# Patient Record
Sex: Male | Born: 1940 | Race: White | Hispanic: No | Marital: Married | Smoking: Current every day smoker
Health system: Southern US, Community
[De-identification: ages and names within clinical notes are randomized; demographics above are authoritative.]

## PROBLEM LIST (undated history)

## (undated) ENCOUNTER — Emergency Department (HOSPITAL_COMMUNITY): Payer: Medicare Other | Source: Home / Self Care

## (undated) DIAGNOSIS — E7800 Pure hypercholesterolemia, unspecified: Secondary | ICD-10-CM

## (undated) DIAGNOSIS — I10 Essential (primary) hypertension: Secondary | ICD-10-CM

## (undated) DIAGNOSIS — C189 Malignant neoplasm of colon, unspecified: Secondary | ICD-10-CM

## (undated) DIAGNOSIS — R519 Headache, unspecified: Secondary | ICD-10-CM

## (undated) DIAGNOSIS — C61 Malignant neoplasm of prostate: Secondary | ICD-10-CM

## (undated) DIAGNOSIS — Z95828 Presence of other vascular implants and grafts: Secondary | ICD-10-CM

## (undated) DIAGNOSIS — I8390 Asymptomatic varicose veins of unspecified lower extremity: Secondary | ICD-10-CM

## (undated) DIAGNOSIS — R51 Headache: Secondary | ICD-10-CM

## (undated) DIAGNOSIS — M899 Disorder of bone, unspecified: Secondary | ICD-10-CM

## (undated) HISTORY — PX: COLON SURGERY: SHX602

## (undated) HISTORY — DX: Malignant neoplasm of prostate: C61

## (undated) HISTORY — DX: Presence of other vascular implants and grafts: Z95.828

## (undated) HISTORY — DX: Disorder of bone, unspecified: M89.9

## (undated) HISTORY — DX: Asymptomatic varicose veins of unspecified lower extremity: I83.90

## (undated) HISTORY — PX: STYLOID PROCESS EXCISION: SHX5198

## (undated) HISTORY — PX: HAND TENDON SURGERY: SHX663

## (undated) HISTORY — PX: PROSTATE SURGERY: SHX751

---

## 1979-04-08 HISTORY — PX: VARICOSE VEIN SURGERY: SHX832

## 1983-08-08 DIAGNOSIS — I8390 Asymptomatic varicose veins of unspecified lower extremity: Secondary | ICD-10-CM

## 1983-08-08 HISTORY — DX: Asymptomatic varicose veins of unspecified lower extremity: I83.90

## 1993-08-07 DIAGNOSIS — M899 Disorder of bone, unspecified: Secondary | ICD-10-CM

## 1993-08-07 HISTORY — DX: Disorder of bone, unspecified: M89.9

## 2003-09-24 ENCOUNTER — Other Ambulatory Visit: Admission: RE | Admit: 2003-09-24 | Discharge: 2003-09-24 | Payer: Self-pay | Admitting: Dermatology

## 2009-02-22 ENCOUNTER — Encounter (INDEPENDENT_AMBULATORY_CARE_PROVIDER_SITE_OTHER): Payer: Self-pay | Admitting: General Surgery

## 2009-02-22 ENCOUNTER — Inpatient Hospital Stay (HOSPITAL_COMMUNITY): Admission: RE | Admit: 2009-02-22 | Discharge: 2009-02-26 | Payer: Self-pay | Admitting: General Surgery

## 2009-03-22 ENCOUNTER — Ambulatory Visit (HOSPITAL_COMMUNITY): Payer: Self-pay | Admitting: Internal Medicine

## 2009-03-22 ENCOUNTER — Encounter (HOSPITAL_COMMUNITY): Admission: RE | Admit: 2009-03-22 | Discharge: 2009-04-21 | Payer: Self-pay | Admitting: Oncology

## 2009-04-05 ENCOUNTER — Ambulatory Visit (HOSPITAL_COMMUNITY): Admission: RE | Admit: 2009-04-05 | Discharge: 2009-04-05 | Payer: Self-pay | Admitting: General Surgery

## 2009-04-22 ENCOUNTER — Encounter (HOSPITAL_COMMUNITY): Admission: RE | Admit: 2009-04-22 | Discharge: 2009-05-05 | Payer: Self-pay | Admitting: Oncology

## 2009-05-10 ENCOUNTER — Encounter (HOSPITAL_COMMUNITY): Admission: RE | Admit: 2009-05-10 | Discharge: 2009-06-09 | Payer: Self-pay | Admitting: Oncology

## 2009-05-10 ENCOUNTER — Ambulatory Visit (HOSPITAL_COMMUNITY): Payer: Self-pay | Admitting: Oncology

## 2009-06-21 ENCOUNTER — Encounter (HOSPITAL_COMMUNITY): Admission: RE | Admit: 2009-06-21 | Discharge: 2009-07-21 | Payer: Self-pay | Admitting: Oncology

## 2009-06-30 ENCOUNTER — Ambulatory Visit (HOSPITAL_COMMUNITY): Payer: Self-pay | Admitting: Oncology

## 2009-07-21 ENCOUNTER — Encounter (HOSPITAL_COMMUNITY): Admission: RE | Admit: 2009-07-21 | Discharge: 2009-08-04 | Payer: Self-pay | Admitting: Oncology

## 2009-08-31 ENCOUNTER — Encounter (HOSPITAL_COMMUNITY): Admission: RE | Admit: 2009-08-31 | Discharge: 2009-09-30 | Payer: Self-pay | Admitting: Oncology

## 2009-08-31 ENCOUNTER — Ambulatory Visit (HOSPITAL_COMMUNITY): Payer: Self-pay | Admitting: Oncology

## 2009-10-12 ENCOUNTER — Encounter (HOSPITAL_COMMUNITY): Admission: RE | Admit: 2009-10-12 | Discharge: 2009-11-11 | Payer: Self-pay | Admitting: Oncology

## 2009-11-12 ENCOUNTER — Ambulatory Visit (HOSPITAL_COMMUNITY): Payer: Self-pay | Admitting: Oncology

## 2009-11-23 ENCOUNTER — Encounter (HOSPITAL_COMMUNITY): Admission: RE | Admit: 2009-11-23 | Discharge: 2009-12-23 | Payer: Self-pay | Admitting: Oncology

## 2010-01-04 ENCOUNTER — Encounter (HOSPITAL_COMMUNITY): Admission: RE | Admit: 2010-01-04 | Discharge: 2010-02-03 | Payer: Self-pay | Admitting: Oncology

## 2010-01-04 ENCOUNTER — Ambulatory Visit (HOSPITAL_COMMUNITY): Payer: Self-pay | Admitting: Oncology

## 2010-02-11 ENCOUNTER — Encounter (HOSPITAL_COMMUNITY): Admission: RE | Admit: 2010-02-11 | Discharge: 2010-03-13 | Payer: Self-pay | Admitting: Oncology

## 2010-03-25 ENCOUNTER — Ambulatory Visit (HOSPITAL_COMMUNITY): Payer: Self-pay | Admitting: Oncology

## 2010-05-06 ENCOUNTER — Encounter (HOSPITAL_COMMUNITY)
Admission: RE | Admit: 2010-05-06 | Discharge: 2010-06-05 | Payer: Self-pay | Source: Home / Self Care | Admitting: Oncology

## 2010-06-17 ENCOUNTER — Encounter (HOSPITAL_COMMUNITY)
Admission: RE | Admit: 2010-06-17 | Discharge: 2010-07-17 | Payer: Self-pay | Source: Home / Self Care | Attending: Oncology | Admitting: Oncology

## 2010-06-17 ENCOUNTER — Ambulatory Visit (HOSPITAL_COMMUNITY): Payer: Self-pay | Admitting: Oncology

## 2010-07-23 ENCOUNTER — Emergency Department (HOSPITAL_COMMUNITY)
Admission: EM | Admit: 2010-07-23 | Discharge: 2010-07-23 | Payer: Self-pay | Source: Home / Self Care | Admitting: Emergency Medicine

## 2010-08-05 ENCOUNTER — Ambulatory Visit (HOSPITAL_COMMUNITY): Payer: Self-pay | Admitting: Oncology

## 2010-08-05 ENCOUNTER — Encounter (HOSPITAL_COMMUNITY)
Admission: RE | Admit: 2010-08-05 | Discharge: 2010-09-04 | Payer: Self-pay | Source: Home / Self Care | Attending: Oncology | Admitting: Oncology

## 2010-08-26 ENCOUNTER — Other Ambulatory Visit (HOSPITAL_COMMUNITY): Payer: Self-pay | Admitting: Oncology

## 2010-08-26 DIAGNOSIS — C189 Malignant neoplasm of colon, unspecified: Secondary | ICD-10-CM

## 2010-08-27 ENCOUNTER — Other Ambulatory Visit (HOSPITAL_COMMUNITY): Payer: Self-pay | Admitting: Oncology

## 2010-08-27 DIAGNOSIS — C61 Malignant neoplasm of prostate: Secondary | ICD-10-CM

## 2010-08-27 DIAGNOSIS — C189 Malignant neoplasm of colon, unspecified: Secondary | ICD-10-CM

## 2010-09-16 ENCOUNTER — Other Ambulatory Visit (HOSPITAL_COMMUNITY): Payer: Self-pay

## 2010-09-20 ENCOUNTER — Other Ambulatory Visit (HOSPITAL_COMMUNITY): Payer: Medicare Other

## 2010-09-20 DIAGNOSIS — C189 Malignant neoplasm of colon, unspecified: Secondary | ICD-10-CM

## 2010-09-20 DIAGNOSIS — Z452 Encounter for adjustment and management of vascular access device: Secondary | ICD-10-CM

## 2010-10-17 LAB — COMPREHENSIVE METABOLIC PANEL
AST: 20 U/L (ref 0–37)
Albumin: 3.7 g/dL (ref 3.5–5.2)
BUN: 10 mg/dL (ref 6–23)
Calcium: 9.5 mg/dL (ref 8.4–10.5)
Creatinine, Ser: 0.97 mg/dL (ref 0.4–1.5)
GFR calc Af Amer: >60 mL/min (ref >60)
Total Bilirubin: 0.4 mg/dL (ref 0.3–1.2)
Total Protein: 7.4 g/dL (ref 6.0–8.3)

## 2010-10-17 LAB — CBC
Hemoglobin: 15.7 g/dL (ref 13.0–17.0)
MCH: 31 pg (ref 26.0–34.0)
MCHC: 34.9 g/dL (ref 30.0–36.0)
Platelets: 267 10*3/uL (ref 150–400)
RDW: 13.3 % (ref 11.5–15.5)

## 2010-10-17 LAB — DIFFERENTIAL
Basophils Absolute: 0 10*3/uL (ref 0.0–0.1)
Eosinophils Relative: 2 % (ref 0–5)
Lymphocytes Relative: 36 % (ref 12–46)
Lymphs Abs: 4.3 10*3/uL — ABNORMAL HIGH (ref 0.7–4.0)
Monocytes Absolute: 1.1 10*3/uL — ABNORMAL HIGH (ref 0.1–1.0)
Neutro Abs: 6.5 10*3/uL (ref 1.7–7.7)

## 2010-10-17 LAB — CEA: CEA: 4.5 ng/mL (ref 0.0–5.0)

## 2010-10-17 LAB — FREE PSA: PSA, Free: <0.1 ng/mL

## 2010-10-17 LAB — PSA: PSA: 0.45 ng/mL (ref <=4.00)

## 2010-10-20 LAB — COMPREHENSIVE METABOLIC PANEL
ALT: 15 U/L (ref 0–53)
AST: 19 U/L (ref 0–37)
Alkaline Phosphatase: 106 U/L (ref 39–117)
Calcium: 9.5 mg/dL (ref 8.4–10.5)
GFR calc Af Amer: >60 mL/min (ref >60)
Potassium: 4 mEq/L (ref 3.5–5.1)
Sodium: 141 mEq/L (ref 135–145)
Total Protein: 6.8 g/dL (ref 6.0–8.3)

## 2010-10-20 LAB — CBC
HCT: 42.3 % (ref 39.0–52.0)
MCV: 92 fL (ref 78.0–100.0)
Platelets: 275 10*3/uL (ref 150–400)
RBC: 4.6 MIL/uL (ref 4.22–5.81)
RDW: 13.4 % (ref 11.5–15.5)
WBC: 11.2 10*3/uL — ABNORMAL HIGH (ref 4.0–10.5)

## 2010-10-23 LAB — COMPREHENSIVE METABOLIC PANEL
ALT: 20 U/L (ref 0–53)
Alkaline Phosphatase: 110 U/L (ref 39–117)
CO2: 25 mEq/L (ref 19–32)
Chloride: 105 mEq/L (ref 96–112)
GFR calc non Af Amer: >60 mL/min (ref >60)
Glucose, Bld: 112 mg/dL — ABNORMAL HIGH (ref 70–99)
Potassium: 4.2 mEq/L (ref 3.5–5.1)
Sodium: 136 mEq/L (ref 135–145)
Total Bilirubin: 0.5 mg/dL (ref 0.3–1.2)
Total Protein: 7.3 g/dL (ref 6.0–8.3)

## 2010-10-23 LAB — CBC
HCT: 49 % (ref 39.0–52.0)
Hemoglobin: 16.4 g/dL (ref 13.0–17.0)
MCH: 30.8 pg (ref 26.0–34.0)
MCV: 92.3 fL (ref 78.0–100.0)
RBC: 5.31 MIL/uL (ref 4.22–5.81)
WBC: 10.4 10*3/uL (ref 4.0–10.5)

## 2010-10-23 LAB — DIFFERENTIAL
Basophils Absolute: 0.1 10*3/uL (ref 0.0–0.1)
Basophils Relative: 1 % (ref 0–1)
Eosinophils Absolute: 0.1 10*3/uL (ref 0.0–0.7)
Monocytes Relative: 8 % (ref 3–12)
Neutrophils Relative %: 62 % (ref 43–77)

## 2010-10-24 LAB — COMPREHENSIVE METABOLIC PANEL
Albumin: 3.7 g/dL (ref 3.5–5.2)
BUN: 13 mg/dL (ref 6–23)
Creatinine, Ser: 0.89 mg/dL (ref 0.4–1.5)
Potassium: 4.4 mEq/L (ref 3.5–5.1)
Total Protein: 6.8 g/dL (ref 6.0–8.3)

## 2010-10-24 LAB — CEA: CEA: 4.6 ng/mL (ref 0.0–5.0)

## 2010-10-24 LAB — DIFFERENTIAL
Lymphocytes Relative: 41 % (ref 12–46)
Monocytes Absolute: 0.9 10*3/uL (ref 0.1–1.0)
Monocytes Relative: 9 % (ref 3–12)
Neutro Abs: 5 10*3/uL (ref 1.7–7.7)

## 2010-10-24 LAB — CBC
MCV: 92.9 fL (ref 78.0–100.0)
Platelets: 256 10*3/uL (ref 150–400)
WBC: 10.2 10*3/uL (ref 4.0–10.5)

## 2010-10-25 LAB — PSA: PSA: 0.27 ng/mL (ref 0.10–4.00)

## 2010-10-26 LAB — DIFFERENTIAL
Basophils Absolute: 0.1 10*3/uL (ref 0.0–0.1)
Basophils Relative: 0 % (ref 0–1)
Eosinophils Absolute: 0.2 10*3/uL (ref 0.0–0.7)
Eosinophils Relative: 2 % (ref 0–5)
Monocytes Absolute: 1.6 10*3/uL — ABNORMAL HIGH (ref 0.1–1.0)

## 2010-10-26 LAB — COMPREHENSIVE METABOLIC PANEL
ALT: 28 U/L (ref 0–53)
AST: 30 U/L (ref 0–37)
Albumin: 3.4 g/dL — ABNORMAL LOW (ref 3.5–5.2)
Alkaline Phosphatase: 126 U/L — ABNORMAL HIGH (ref 39–117)
CO2: 30 mEq/L (ref 19–32)
Chloride: 106 mEq/L (ref 96–112)
GFR calc Af Amer: >60 mL/min (ref >60)
GFR calc non Af Amer: >60 mL/min (ref >60)
Potassium: 4.3 mEq/L (ref 3.5–5.1)
Total Bilirubin: 0.4 mg/dL (ref 0.3–1.2)

## 2010-10-26 LAB — CBC
Hemoglobin: 15.9 g/dL (ref 13.0–17.0)
MCHC: 33.8 g/dL (ref 30.0–36.0)
RBC: 4.61 MIL/uL (ref 4.22–5.81)
WBC: 12.9 10*3/uL — ABNORMAL HIGH (ref 4.0–10.5)

## 2010-10-26 LAB — PSA: PSA: 0.19 ng/mL (ref 0.10–4.00)

## 2010-10-26 LAB — CEA: CEA: 6.6 ng/mL — ABNORMAL HIGH (ref 0.0–5.0)

## 2010-11-01 ENCOUNTER — Encounter (HOSPITAL_COMMUNITY): Payer: Medicare Other | Attending: Oncology

## 2010-11-01 ENCOUNTER — Other Ambulatory Visit (HOSPITAL_COMMUNITY): Payer: Medicare Other

## 2010-11-01 DIAGNOSIS — J4489 Other specified chronic obstructive pulmonary disease: Secondary | ICD-10-CM | POA: Insufficient documentation

## 2010-11-01 DIAGNOSIS — R972 Elevated prostate specific antigen [PSA]: Secondary | ICD-10-CM | POA: Insufficient documentation

## 2010-11-01 DIAGNOSIS — C189 Malignant neoplasm of colon, unspecified: Secondary | ICD-10-CM

## 2010-11-01 DIAGNOSIS — F17200 Nicotine dependence, unspecified, uncomplicated: Secondary | ICD-10-CM | POA: Insufficient documentation

## 2010-11-01 DIAGNOSIS — C61 Malignant neoplasm of prostate: Secondary | ICD-10-CM

## 2010-11-01 DIAGNOSIS — J449 Chronic obstructive pulmonary disease, unspecified: Secondary | ICD-10-CM | POA: Insufficient documentation

## 2010-11-01 DIAGNOSIS — Z09 Encounter for follow-up examination after completed treatment for conditions other than malignant neoplasm: Secondary | ICD-10-CM | POA: Insufficient documentation

## 2010-11-01 DIAGNOSIS — Z85038 Personal history of other malignant neoplasm of large intestine: Secondary | ICD-10-CM | POA: Insufficient documentation

## 2010-11-01 DIAGNOSIS — Z8546 Personal history of malignant neoplasm of prostate: Secondary | ICD-10-CM | POA: Insufficient documentation

## 2010-11-08 LAB — DIFFERENTIAL
Basophils Absolute: 0 10*3/uL (ref 0.0–0.1)
Basophils Relative: 1 % (ref 0–1)
Eosinophils Absolute: 0.1 10*3/uL (ref 0.0–0.7)
Monocytes Relative: 16 % — ABNORMAL HIGH (ref 3–12)
Neutrophils Relative %: 41 % — ABNORMAL LOW (ref 43–77)

## 2010-11-08 LAB — CBC
MCHC: 34.1 g/dL (ref 30.0–36.0)
MCV: 98.9 fL (ref 78.0–100.0)
Platelets: 130 10*3/uL — ABNORMAL LOW (ref 150–400)
RBC: 4.39 MIL/uL (ref 4.22–5.81)
RDW: 27.5 % — ABNORMAL HIGH (ref 11.5–15.5)

## 2010-11-09 LAB — COMPREHENSIVE METABOLIC PANEL
ALT: 32 U/L (ref 0–53)
AST: 32 U/L (ref 0–37)
CO2: 27 mEq/L (ref 19–32)
Calcium: 9.1 mg/dL (ref 8.4–10.5)
GFR calc Af Amer: >60 mL/min (ref >60)
Potassium: 3.9 mEq/L (ref 3.5–5.1)
Sodium: 138 mEq/L (ref 135–145)
Total Protein: 6.4 g/dL (ref 6.0–8.3)

## 2010-11-09 LAB — CBC
HCT: 42 % (ref 39.0–52.0)
MCHC: 33.6 g/dL (ref 30.0–36.0)
MCHC: 34.5 g/dL (ref 30.0–36.0)
MCV: 89.9 fL (ref 78.0–100.0)
Platelets: 162 10*3/uL (ref 150–400)
RBC: 4.56 MIL/uL (ref 4.22–5.81)
RDW: 29.6 % — ABNORMAL HIGH (ref 11.5–15.5)

## 2010-11-09 LAB — DIFFERENTIAL
Basophils Absolute: 0.1 10*3/uL (ref 0.0–0.1)
Basophils Relative: 1 % (ref 0–1)
Eosinophils Absolute: 0.1 10*3/uL (ref 0.0–0.7)
Eosinophils Absolute: 0.1 10*3/uL (ref 0.0–0.7)
Eosinophils Relative: 1 % (ref 0–5)
Eosinophils Relative: 1 % (ref 0–5)
Lymphs Abs: 2.9 10*3/uL (ref 0.7–4.0)
Monocytes Relative: 17 % — ABNORMAL HIGH (ref 3–12)

## 2010-11-10 LAB — DIFFERENTIAL
Basophils Absolute: 0 10*3/uL (ref 0.0–0.1)
Eosinophils Relative: 0 % (ref 0–5)
Lymphocytes Relative: 31 % (ref 12–46)
Monocytes Absolute: 1.4 10*3/uL — ABNORMAL HIGH (ref 0.1–1.0)

## 2010-11-10 LAB — COMPREHENSIVE METABOLIC PANEL
AST: 24 U/L (ref 0–37)
Albumin: 3.7 g/dL (ref 3.5–5.2)
Chloride: 106 mEq/L (ref 96–112)
Creatinine, Ser: 0.79 mg/dL (ref 0.4–1.5)
GFR calc Af Amer: >60 mL/min (ref >60)
Potassium: 2.9 mEq/L — ABNORMAL LOW (ref 3.5–5.1)
Total Bilirubin: 0.5 mg/dL (ref 0.3–1.2)

## 2010-11-10 LAB — CBC
MCV: 83.5 fL (ref 78.0–100.0)
Platelets: 219 10*3/uL (ref 150–400)
WBC: 10.5 10*3/uL (ref 4.0–10.5)

## 2010-11-11 LAB — CBC
HCT: 36.8 % — ABNORMAL LOW (ref 39.0–52.0)
MCHC: 34.1 g/dL (ref 30.0–36.0)
MCV: 81.3 fL (ref 78.0–100.0)
Platelets: 336 10*3/uL (ref 150–400)
Platelets: 371 10*3/uL (ref 150–400)
RDW: 15.9 % — ABNORMAL HIGH (ref 11.5–15.5)
RDW: 15.4 % (ref 11.5–15.5)

## 2010-11-11 LAB — COMPREHENSIVE METABOLIC PANEL
ALT: 12 U/L (ref 0–53)
Albumin: 3.5 g/dL (ref 3.5–5.2)
Alkaline Phosphatase: 92 U/L (ref 39–117)
Calcium: 9.2 mg/dL (ref 8.4–10.5)
Potassium: 3.7 mEq/L (ref 3.5–5.1)
Sodium: 138 mEq/L (ref 135–145)
Total Protein: 6.9 g/dL (ref 6.0–8.3)

## 2010-11-11 LAB — DIFFERENTIAL
Basophils Absolute: 0 10*3/uL (ref 0.0–0.1)
Basophils Relative: 0 % (ref 0–1)
Basophils Relative: 1 % (ref 0–1)
Eosinophils Absolute: 0 10*3/uL (ref 0.0–0.7)
Eosinophils Absolute: 0.1 10*3/uL (ref 0.0–0.7)
Eosinophils Relative: 0 % (ref 0–5)
Lymphocytes Relative: 19 % (ref 12–46)
Lymphs Abs: 3.1 10*3/uL (ref 0.7–4.0)
Monocytes Absolute: 1.2 10*3/uL — ABNORMAL HIGH (ref 0.1–1.0)
Monocytes Relative: 11 % (ref 3–12)
Neutro Abs: 6.5 10*3/uL (ref 1.7–7.7)

## 2010-11-11 LAB — FERRITIN: Ferritin: 20 ng/mL — ABNORMAL LOW (ref 22–322)

## 2010-11-12 LAB — COMPREHENSIVE METABOLIC PANEL
AST: 16 U/L (ref 0–37)
BUN: 13 mg/dL (ref 6–23)
CO2: 30 mEq/L (ref 19–32)
Calcium: 9.5 mg/dL (ref 8.4–10.5)
Chloride: 106 mEq/L (ref 96–112)
Creatinine, Ser: 0.86 mg/dL (ref 0.4–1.5)
GFR calc Af Amer: >60 mL/min (ref >60)
GFR calc non Af Amer: >60 mL/min (ref >60)
Total Bilirubin: 0.1 mg/dL — ABNORMAL LOW (ref 0.3–1.2)

## 2010-11-12 LAB — CBC
Hemoglobin: 12.8 g/dL — ABNORMAL LOW (ref 13.0–17.0)
MCHC: 33.5 g/dL (ref 30.0–36.0)
MCHC: 33.8 g/dL (ref 30.0–36.0)
MCV: 80.8 fL (ref 78.0–100.0)
MCV: 81.9 fL (ref 78.0–100.0)
Platelets: 340 10*3/uL (ref 150–400)
RBC: 4.85 MIL/uL (ref 4.22–5.81)
RDW: 15.4 % (ref 11.5–15.5)

## 2010-11-12 LAB — BASIC METABOLIC PANEL
BUN: 9 mg/dL (ref 6–23)
CO2: 28 mEq/L (ref 19–32)
Chloride: 104 mEq/L (ref 96–112)
Creatinine, Ser: 0.83 mg/dL (ref 0.4–1.5)

## 2010-11-12 LAB — IRON AND TIBC
Iron: 24 ug/dL — ABNORMAL LOW (ref 42–135)
Saturation Ratios: 6 % — ABNORMAL LOW (ref 20–55)
TIBC: 376 ug/dL (ref 215–435)

## 2010-11-12 LAB — DIFFERENTIAL
Basophils Absolute: 0.1 10*3/uL (ref 0.0–0.1)
Basophils Relative: 1 % (ref 0–1)
Neutro Abs: 6.2 10*3/uL (ref 1.7–7.7)
Neutrophils Relative %: 50 % (ref 43–77)

## 2010-11-13 LAB — CBC
HCT: 33.4 % — ABNORMAL LOW (ref 39.0–52.0)
HCT: 36.1 % — ABNORMAL LOW (ref 39.0–52.0)
MCV: 82.1 fL (ref 78.0–100.0)
MCV: 82.5 fL (ref 78.0–100.0)
MCV: 83.9 fL (ref 78.0–100.0)
Platelets: 254 10*3/uL (ref 150–400)
Platelets: 307 10*3/uL (ref 150–400)
RBC: 4.38 MIL/uL (ref 4.22–5.81)
RDW: 15.2 % (ref 11.5–15.5)
WBC: 13 10*3/uL — ABNORMAL HIGH (ref 4.0–10.5)
WBC: 18 10*3/uL — ABNORMAL HIGH (ref 4.0–10.5)

## 2010-11-13 LAB — BASIC METABOLIC PANEL
BUN: 8 mg/dL (ref 6–23)
Chloride: 106 mEq/L (ref 96–112)
Chloride: 107 mEq/L (ref 96–112)
GFR calc Af Amer: >60 mL/min (ref >60)
GFR calc non Af Amer: >60 mL/min (ref >60)
GFR calc non Af Amer: >60 mL/min (ref >60)
Glucose, Bld: 120 mg/dL — ABNORMAL HIGH (ref 70–99)
Potassium: 3.9 mEq/L (ref 3.5–5.1)
Potassium: 4.1 mEq/L (ref 3.5–5.1)

## 2010-11-13 LAB — DIFFERENTIAL
Basophils Absolute: 0 10*3/uL (ref 0.0–0.1)
Eosinophils Absolute: 0.1 10*3/uL (ref 0.0–0.7)
Eosinophils Relative: 0 % (ref 0–5)
Eosinophils Relative: 1 % (ref 0–5)
Lymphocytes Relative: 12 % (ref 12–46)
Lymphs Abs: 2.1 10*3/uL (ref 0.7–4.0)
Monocytes Absolute: 1.9 10*3/uL — ABNORMAL HIGH (ref 0.1–1.0)
Neutro Abs: 14 10*3/uL — ABNORMAL HIGH (ref 1.7–7.7)

## 2010-11-13 LAB — COMPREHENSIVE METABOLIC PANEL
AST: 16 U/L (ref 0–37)
Albumin: 3.8 g/dL (ref 3.5–5.2)
Chloride: 103 mEq/L (ref 96–112)
Creatinine, Ser: 0.82 mg/dL (ref 0.4–1.5)
GFR calc Af Amer: >60 mL/min (ref >60)
Total Bilirubin: 0.6 mg/dL (ref 0.3–1.2)

## 2010-11-13 LAB — CROSSMATCH

## 2010-11-13 LAB — MAGNESIUM
Magnesium: 1.8 mg/dL (ref 1.5–2.5)
Magnesium: 2.1 mg/dL (ref 1.5–2.5)

## 2010-11-13 LAB — CEA: CEA: 3.3 ng/mL (ref 0.0–5.0)

## 2010-11-13 LAB — ALBUMIN: Albumin: 2.8 g/dL — ABNORMAL LOW (ref 3.5–5.2)

## 2010-12-13 ENCOUNTER — Encounter (HOSPITAL_COMMUNITY): Payer: Medicare Other | Attending: Oncology

## 2010-12-13 DIAGNOSIS — R972 Elevated prostate specific antigen [PSA]: Secondary | ICD-10-CM | POA: Insufficient documentation

## 2010-12-13 DIAGNOSIS — J4489 Other specified chronic obstructive pulmonary disease: Secondary | ICD-10-CM | POA: Insufficient documentation

## 2010-12-13 DIAGNOSIS — Z09 Encounter for follow-up examination after completed treatment for conditions other than malignant neoplasm: Secondary | ICD-10-CM | POA: Insufficient documentation

## 2010-12-13 DIAGNOSIS — Z8546 Personal history of malignant neoplasm of prostate: Secondary | ICD-10-CM | POA: Insufficient documentation

## 2010-12-13 DIAGNOSIS — Z85038 Personal history of other malignant neoplasm of large intestine: Secondary | ICD-10-CM | POA: Insufficient documentation

## 2010-12-13 DIAGNOSIS — J449 Chronic obstructive pulmonary disease, unspecified: Secondary | ICD-10-CM | POA: Insufficient documentation

## 2010-12-13 DIAGNOSIS — C189 Malignant neoplasm of colon, unspecified: Secondary | ICD-10-CM

## 2010-12-13 DIAGNOSIS — F17200 Nicotine dependence, unspecified, uncomplicated: Secondary | ICD-10-CM | POA: Insufficient documentation

## 2010-12-13 DIAGNOSIS — Z452 Encounter for adjustment and management of vascular access device: Secondary | ICD-10-CM

## 2010-12-20 NOTE — H&P (Signed)
Roberto [name], Roberto [name]               ACCOUNT NO.:  [id number]   MEDICAL RECORD NO.:  [id number]          PATIENT TYPE:  AMB   LOCATION:  DAY                           FACILITY:  [facility]   PHYSICIAN:  Dalia Heading, M.D.  DATE OF BIRTH:  [birth year]   DATE OF ADMISSION:  DATE OF DISCHARGE:  LH                              HISTORY & PHYSICAL   CHIEF COMPLAINT:  Neoplasm, unspecified.   HISTORY OF PRESENT ILLNESS:  The patient is a 70 year old white male who  is referred for evaluation and treatment of the colon neoplasm.  This  was found on CT scan for workup of a rising PSA level.  He has a history  of prostate cancer.  Colonoscopy found a mass that was circumferential  in nature in the ascending colon.  The patient has been otherwise  asymptomatic.  No family history of colon carcinoma is noted.   PAST MEDICAL HISTORY:  Prostate cancer.   PAST SURGICAL HISTORY:  Prostatectomy in 2004, varicose vein excision,  styloid bone removal in 1992.   CURRENT MEDICATIONS:  Lovastatin 40 mg p.o. daily   ALLERGIES:  No known drug allergies.   REVIEW OF SYSTEMS:  The patient smokes over a pack of cigarettes a day.  He denies any alcohol use.  He denies any other cardiopulmonary  difficulties or bleeding disorders.  He states that did have somewhat of  a difficult time intubating him in 2004, but ultimately he was able to  be intubated for his prostate surgery once [facility] had the  appropriate equipment present.   PHYSICAL EXAMINATION:  GENERAL:  The patient is a well-developed, well-  nourished white male in no acute distress.  HEENT:  No scleral icterus.  LUNGS:  Clear to auscultation with equal breath sounds bilaterally.  HEART:  Regular rate and rhythm without S3, S4, or murmurs.  ABDOMEN:  Soft, nontender, nondistended.  No hepatosplenomegaly, masses,  or hernias identified.  Reports from Dr. Darryll Capers office in [place]  were noted.   IMPRESSION:  Neoplasm, unspecified.   PLAN:  The patient is scheduled for a right hemicolectomy on February 22, 2009.  The risks and benefits of the procedure including bleeding,  infection, cardiopulmonary difficulties, the possibly of a blood  transfusion were fully explained to the patient, gave informed consent.      Dalia Heading, M.D.  Electronically Signed     MAJ/MEDQ  D:  02/11/2009  T:  02/12/2009  Job:  [id number]   cc:   Short-Stay at [facility]   Dr. Christeen Douglas, [place]

## 2010-12-20 NOTE — H&P (Signed)
[name], [name]               ACCOUNT NO.:  [id number]   MEDICAL RECORD NO.:  [id number]         PATIENT TYPE:  PAMB   LOCATION:  DAY                           FACILITY:  [facility]   PHYSICIAN:  Dalia Heading, M.D.  DATE OF BIRTH:  1941-07-15   DATE OF ADMISSION:  DATE OF DISCHARGE:  LH                              HISTORY & PHYSICAL   CHIEF COMPLAINT:  Colon carcinoma, T4, N1, M0, need for central venous  access.   HISTORY OF PRESENT ILLNESS:  The patient is a 70 year old, white male,  who presents for Port-A-Cath insertion.  He recently underwent a partial  colectomy for colon carcinoma and is in need for central venous access  for chemotherapy.   PAST MEDICAL HISTORY:  Prostate cancer.   PAST SURGICAL HISTORY:  As noted above, prostatectomy in 2004, styloid  bone removal in 1992.   CURRENT MEDICATIONS:  1. Lovastatin 40 mg p.o. daily.  2. Vicodin p.r.n. pain.   ALLERGIES:  No known drug allergies.   REVIEW OF SYSTEMS:  The patient smokes a pack of cigarettes a day.  He  denies any alcohol use.  He denies any other cardiopulmonary  difficulties or bleeding disorders.   PHYSICAL EXAMINATION:  GENERAL:  The patient is a well-developed, well-  nourished, white male, in no acute distress.  LUNGS:  Clear to auscultation with equal breath sounds bilaterally.  HEART:  Regular rate and rhythm without S3, S4, murmurs.  ABDOMEN:  Soft, nontender, nondistended.  A well-healed surgical scars  are noted.   IMPRESSION:  Colon carcinoma, need for central venous access.   PLAN:  The patient is scheduled for Port-A-Cath insertion on April 05, 2009.  The risks and benefits of the procedure including bleeding,  infection, pneumothorax were fully explained to the patient, gave  informed consent.      Dalia Heading, M.D.  Electronically Signed     MAJ/MEDQ  D:  03/31/2009  T:  04/01/2009  Job:  [id number]   cc:   Ladona Horns. Mariel Sleet, MD  Fax: [phone]   [place]

## 2010-12-20 NOTE — Op Note (Signed)
NAMEGREYSON, Roberto [name]               ACCOUNT NO.:  [id number]   MEDICAL RECORD NO.:  [id number]          PATIENT TYPE:  INP   LOCATION:  A307                          FACILITY:  [facility]   PHYSICIAN:  Dalia Heading, M.D.  DATE OF BIRTH:  Aug 09, 1940   DATE OF PROCEDURE:  02/22/2009  DATE OF DISCHARGE:                               OPERATIVE REPORT   PREOPERATIVE DIAGNOSIS:  Right colon neoplasm.   POSTOPERATIVE DIAGNOSES:  Right colon neoplasm, liver lesion.   PROCEDURE:  Right hemicolectomy, wedge resection of liver lesion.   SURGEON:  Dalia Heading, MD   ANESTHESIA:  General endotracheal.   INDICATIONS:  The patient is a 69 year old, white male, who was found on  CT scan of the abdomen and colonoscopy to have an ascending colon  neoplasm which was biopsy proven to be adenocarcinoma.  The patient now  comes to the operating room for right hemicolectomy.  The risks and  benefits of the procedure including bleeding, infection, cardiopulmonary  difficulties, and the possibility of needing a blood transfusion were  fully explained to the patient, gave informed consent.   PROCEDURE NOTE:  The patient was placed in supine position.  After  induction of general endotracheal anesthesia, the abdomen was prepped  and draped using the usual sterile technique with Betadine.  Surgical  site confirmation was performed.   A right paramedian incision was made down to the perineum.  The  peritoneal cavity was entered into without difficulty.  The oral gastric  tube was noted to be in appropriate position of the stomach.  The small  bowel was inspected and noted to be within normal limits.  In the  ascending colon, an obvious neoplasm was present just above the cecum.  There was another tattoo mark in the proximal transverse colon.  This  was assumed to be a polypectomy site which by pathology was negative for  malignancy.  On inspection of the liver, there were multiple  granulomatous hard  lesions on the surface of the liver, in close  proximity to the gallbladder as well as the high on the dome of the  liver.  A wedge resection of the largest lesion just in close proximity  to the gallbladder fossa was excised without difficulty.  FloSeal and  Surgicel were placed over the wedge resection site.  This was inspected  later in the procedure and no abnormal bleeding was noted.  The specimen  was sent to pathology for further examination.  The right colon was  mobilized along its peritoneal reflection.  A GIA stapler was placed  across the terminal ileum as well as the mid transverse colon and fired.  The mesentery was divided using the LigaSure.  The specimen was then  sent to pathology for further examination.  The specimen contained the  obvious masses as well as the transverse colon tattooed lesion.  A side-  to-side ileocolic anastomosis was then performed using a GIA 70 stapler.  The enterotomy was closed using a TA stapler.  The staple line was  bolstered using 3-0 silk sutures.  Omentum was then used  to cover the  anastomosis and fixated using 3-0 silk sutures.  The mesenteric defect  was closed using a 2-0 chromic gut running suture.  The bowel was then  returned to the abdominal cavity in orderly fashion.  All operating  personnel then changed their gloves.   The abdomen was copiously irrigated with gentamicin normal saline.  All  fluid was then evacuated from the abdominal cavity.  The fascia was  reapproximated using a looped O Novofil running suture.  An on cue pain  pump was then placed with the catheter underneath the skin.  The skin  was then reapproximated using staples.  Betadine ointment and dry  sterile dressings were applied.   All tape and needle counts correct at the end of the procedure.  The  patient was extubated in the operating room and went back to recovery  room awake in stable condition.  Complications none.   SPECIMEN:  1. Right colon.  2.  Wedge resection of liver.   BLOOD LOSS:  50 mL.      Dalia Heading, M.D.  Electronically Signed     MAJ/MEDQ  D:  02/22/2009  T:  02/23/2009  Job:  [id number]

## 2010-12-20 NOTE — Op Note (Signed)
NAMEDEVONN, [name]               ACCOUNT NO.:  [id number]   MEDICAL RECORD NO.:  [id number]          PATIENT TYPE:  AMB   LOCATION:  DAY                           FACILITY:  [facility]   PHYSICIAN:  Dalia Heading, M.D.  DATE OF BIRTH:  1941/06/12   DATE OF PROCEDURE:  DATE OF DISCHARGE:                               OPERATIVE REPORT   PREOPERATIVE DIAGNOSIS:  Colon carcinoma, T4, N0, M0, need for central  venous access.   POSTOPERATIVE DIAGNOSIS:  Colon carcinoma, T4, N0, M0, need for central  venous access.   PROCEDURE:  Port-A-Cath insertion.   SURGEON:  Dalia Heading, MD   ANESTHESIA:  MAC.   INDICATIONS:  The patient is a 70 year old white male who recently  underwent partial colectomy for colon carcinoma and is about to undergo  chemotherapy.  The risks and benefits of the procedure including  bleeding, infection, pneumothorax were fully explained to the patient,  gave informed consent.   PROCEDURE NOTE:  The patient was placed in the Trendelenburg position  after the left upper chest was prepped and draped using the usual  sterile technique with DuraPrep.  Surgical site confirmation was  performed.  Xylocaine 1% was used for local anesthesia.   An incision was made below the left clavicle.  A subcutaneous pocket was  then formed.  A needle was advanced into left subclavian vein using the  Seldinger technique without difficulty.  Guidewire was then advanced  into the right atrium under fluoroscopic guidance.  An introducer and  peel-away sheath were placed over the guidewire.  The catheter was then  inserted through the peel-away sheath, and the peel-away sheath was  removed.  The catheter was then attached to the port.  The port placed  in the subcutaneous pocket.  Adequate positioning was confirmed by  fluoroscopy.  The subcutaneous layer was reapproximated using a 3-0  Vicryl interrupted suture.  The skin was closed using a 4-0 Vicryl  subcuticular suture.   Dermabond was then applied to the incision.  The  port was then accessed and flushed with 3000 units of heparin.  Good  back bleeding was noted from the port.  The access was left in place as  the patient is to undergo chemotherapy later this week.  A Tegaderm  dressing was then applied.   All tape and needle counts were correct at the end of the procedure.  The patient was transferred to PACU in stable condition.  A chest x-ray  will be performed at that time.   COMPLICATIONS:  None.   SPECIMEN:  None.   BLOOD LOSS:  Minimal.      Dalia Heading, M.D.  Electronically Signed     MAJ/MEDQ  D:  04/05/2009  T:  04/05/2009  Job:  [id number]   cc:   Ladona Horns. Mariel Sleet, MD  Fax: [phone]   Adrienne Mocha, MD

## 2010-12-20 NOTE — Discharge Summary (Signed)
Roberto [name], Roberto [name]               ACCOUNT NO.:  [id number]   MEDICAL RECORD NO.:  [id number]          PATIENT TYPE:  INP   LOCATION:  A307                          FACILITY:  [facility]   PHYSICIAN:  Dalia Heading, M.D.  DATE OF BIRTH:  Feb 13, 1941   DATE OF ADMISSION:  02/22/2009  DATE OF DISCHARGE:  07/23/2010LH                               DISCHARGE SUMMARY   HOSPITAL COURSE SUMMARY:  The patient is a 70 year old white male who  was found on colonoscopy by Dr. Lenore Manner  in [place], [place] to have  an adenocarcinoma of the ascending colon.  He presented to the [facility] on February 22, 2009, and underwent a right hemicolectomy, as  well as wedge resection of a liver lesion.  He tolerated the surgery  well.  His postoperative course was for the most part unremarkable.  His  diet was advanced without difficulty once his bowel function returned.  His CEA level was 3.3.  Final pathology did reveal a T4 N1 M0  adenocarcinoma of the ascending colon.  The 14 lymph nodes were resected  and were negative for malignancy.  He did have some satellite lesions  outside the main lesion of the colon.   The patient is being discharged home on postoperative day #4 in good and  improving condition.   DISCHARGE INSTRUCTIONS:  The patient is to follow up with Dalia Heading, M.D. on March 04, 2009.   DISCHARGE MEDICATIONS:  Include:  1. Vicodin one to two tablets p.o. q.4h. p.r.n. pain.  2. Lovastatin 40 mg p.o. daily.   PRINCIPAL DIAGNOSES:  1. Adenocarcinoma of the ascending colon.  2. High cholesterol levels.   PRINCIPAL PROCEDURE:  Right hemicolectomy with wedge resection of the  liver on February 22, 2009.  Of note was the liver biopsy was negative for  malignancy.      Dalia Heading, M.D.  Electronically Signed     MAJ/MEDQ  D:  02/26/2009  T:  02/26/2009  Job:  [id number]   cc:   Schiflett, Dr.  Octavio Manns, [place]

## 2011-01-24 ENCOUNTER — Other Ambulatory Visit (HOSPITAL_COMMUNITY): Payer: Self-pay | Admitting: Oncology

## 2011-01-24 ENCOUNTER — Encounter (HOSPITAL_COMMUNITY): Payer: Medicare Other | Attending: Oncology

## 2011-01-24 DIAGNOSIS — Z8546 Personal history of malignant neoplasm of prostate: Secondary | ICD-10-CM | POA: Insufficient documentation

## 2011-01-24 DIAGNOSIS — R972 Elevated prostate specific antigen [PSA]: Secondary | ICD-10-CM | POA: Insufficient documentation

## 2011-01-24 DIAGNOSIS — J449 Chronic obstructive pulmonary disease, unspecified: Secondary | ICD-10-CM | POA: Insufficient documentation

## 2011-01-24 DIAGNOSIS — Z09 Encounter for follow-up examination after completed treatment for conditions other than malignant neoplasm: Secondary | ICD-10-CM | POA: Insufficient documentation

## 2011-01-24 DIAGNOSIS — Z85038 Personal history of other malignant neoplasm of large intestine: Secondary | ICD-10-CM | POA: Insufficient documentation

## 2011-01-24 DIAGNOSIS — C189 Malignant neoplasm of colon, unspecified: Secondary | ICD-10-CM

## 2011-01-24 DIAGNOSIS — F17200 Nicotine dependence, unspecified, uncomplicated: Secondary | ICD-10-CM | POA: Insufficient documentation

## 2011-01-24 DIAGNOSIS — J4489 Other specified chronic obstructive pulmonary disease: Secondary | ICD-10-CM | POA: Insufficient documentation

## 2011-01-24 LAB — COMPREHENSIVE METABOLIC PANEL
ALT: 12 U/L (ref 0–53)
Albumin: 3.7 g/dL (ref 3.5–5.2)
Alkaline Phosphatase: 110 U/L (ref 39–117)
BUN: 18 mg/dL (ref 6–23)
Chloride: 101 mEq/L (ref 96–112)
Potassium: 3.6 mEq/L (ref 3.5–5.1)
Sodium: 139 mEq/L (ref 135–145)
Total Bilirubin: 0.2 mg/dL — ABNORMAL LOW (ref 0.3–1.2)

## 2011-01-24 LAB — DIFFERENTIAL
Eosinophils Relative: 2 % (ref 0–5)
Lymphocytes Relative: 43 % (ref 12–46)
Lymphs Abs: 5.1 10*3/uL — ABNORMAL HIGH (ref 0.7–4.0)
Monocytes Absolute: 1.2 10*3/uL — ABNORMAL HIGH (ref 0.1–1.0)
Neutro Abs: 5.3 10*3/uL (ref 1.7–7.7)

## 2011-01-24 LAB — CBC
MCH: 30.3 pg (ref 26.0–34.0)
MCHC: 33.4 g/dL (ref 30.0–36.0)
Platelets: 315 10*3/uL (ref 150–400)

## 2011-01-25 LAB — CEA: CEA: 3.8 ng/mL (ref 0.0–5.0)

## 2011-01-26 ENCOUNTER — Encounter (HOSPITAL_COMMUNITY): Payer: Self-pay | Admitting: *Deleted

## 2011-02-13 ENCOUNTER — Encounter (HOSPITAL_COMMUNITY): Payer: Self-pay

## 2011-02-13 ENCOUNTER — Ambulatory Visit (HOSPITAL_COMMUNITY): Payer: Self-pay

## 2011-02-13 ENCOUNTER — Encounter (HOSPITAL_COMMUNITY)
Admission: RE | Admit: 2011-02-13 | Discharge: 2011-02-13 | Payer: Medicare Other | Source: Ambulatory Visit | Attending: Oncology | Admitting: Oncology

## 2011-02-13 ENCOUNTER — Encounter (HOSPITAL_COMMUNITY)
Admission: RE | Admit: 2011-02-13 | Discharge: 2011-02-13 | Disposition: A | Discharge status: Home / Self Care | Payer: Medicare Other | Source: Ambulatory Visit | Attending: Oncology | Admitting: Oncology

## 2011-02-13 DIAGNOSIS — C61 Malignant neoplasm of prostate: Secondary | ICD-10-CM | POA: Insufficient documentation

## 2011-02-13 DIAGNOSIS — C189 Malignant neoplasm of colon, unspecified: Secondary | ICD-10-CM | POA: Insufficient documentation

## 2011-02-13 MED ORDER — TECHNETIUM TC 99M MEDRONATE IV KIT
25.0000 | PACK | Freq: Once | INTRAVENOUS | Status: AC | PRN
  Administered 2011-02-13: 24 via INTRAVENOUS

## 2011-02-14 ENCOUNTER — Encounter (HOSPITAL_COMMUNITY): Payer: Self-pay

## 2011-02-14 ENCOUNTER — Other Ambulatory Visit (HOSPITAL_COMMUNITY): Payer: Self-pay

## 2011-02-14 ENCOUNTER — Ambulatory Visit (HOSPITAL_COMMUNITY)
Admission: RE | Admit: 2011-02-14 | Discharge: 2011-02-14 | Disposition: A | Discharge status: Home / Self Care | Payer: Medicare Other | Source: Ambulatory Visit | Attending: Oncology | Admitting: Oncology

## 2011-02-14 DIAGNOSIS — Z8546 Personal history of malignant neoplasm of prostate: Secondary | ICD-10-CM | POA: Insufficient documentation

## 2011-02-14 DIAGNOSIS — C189 Malignant neoplasm of colon, unspecified: Secondary | ICD-10-CM | POA: Insufficient documentation

## 2011-02-14 DIAGNOSIS — R9389 Abnormal findings on diagnostic imaging of other specified body structures: Secondary | ICD-10-CM | POA: Insufficient documentation

## 2011-02-14 DIAGNOSIS — R918 Other nonspecific abnormal finding of lung field: Secondary | ICD-10-CM | POA: Insufficient documentation

## 2011-02-14 HISTORY — DX: Malignant neoplasm of colon, unspecified: C18.9

## 2011-02-14 MED ORDER — IOHEXOL 300 MG/ML  SOLN
100.0000 mL | Freq: Once | INTRAMUSCULAR | Status: AC | PRN
  Administered 2011-02-14: 100 mL via INTRAVENOUS

## 2011-02-20 ENCOUNTER — Encounter (HOSPITAL_COMMUNITY): Payer: Medicare Other | Attending: Oncology | Admitting: Oncology

## 2011-02-20 DIAGNOSIS — C61 Malignant neoplasm of prostate: Secondary | ICD-10-CM

## 2011-02-20 DIAGNOSIS — C189 Malignant neoplasm of colon, unspecified: Secondary | ICD-10-CM

## 2011-02-20 NOTE — Patient Instructions (Signed)
[facility]  Discharge Instructions  RECOMMENDATIONS MADE BY THE CONSULTANT AND ANY TEST RESULTS WILL BE SENT TO YOUR REFERRING DOCTOR.   EXAM FINDINGS BY MD TODAY AND SIGNS AND SYMPTOMS TO REPORT TO CLINIC OR PRIMARY MD:  Exam good and scans discussed  MEDICATIONS PRESCRIBED: none   INSTRUCTIONS GIVEN AND DISCUSSED: Other  none  SPECIAL INSTRUCTIONS/FOLLOW-UP: Lab work Needed  3months and 6 months MD visit in 6 months We will continue port flush every 6 weeks   I acknowledge that I have been informed and understand all the instructions given to me and received a copy. I do not have any more questions at this time, but understand that I may call the Specialty Clinic at [facility] at [phone] during business hours should I have any further questions or need assistance in obtaining follow-up care.    __________________________________________  _____________  __________ Signature of Patient or Authorized Representative            Date                   Time    __________________________________________ Nurse's Signature

## 2011-02-20 NOTE — Progress Notes (Signed)
This office note has been dictated.

## 2011-03-07 ENCOUNTER — Encounter (HOSPITAL_BASED_OUTPATIENT_CLINIC_OR_DEPARTMENT_OTHER): Payer: Medicare Other

## 2011-03-07 DIAGNOSIS — C189 Malignant neoplasm of colon, unspecified: Secondary | ICD-10-CM

## 2011-03-07 DIAGNOSIS — Z452 Encounter for adjustment and management of vascular access device: Secondary | ICD-10-CM

## 2011-03-07 MED ORDER — HEPARIN SOD (PORK) LOCK FLUSH 100 UNIT/ML IV SOLN
INTRAVENOUS | Status: AC
  Filled 2011-03-07: qty 5

## 2011-03-07 MED ORDER — HEPARIN SOD (PORK) LOCK FLUSH 100 UNIT/ML IV SOLN
500.0000 [IU] | Freq: Once | INTRAVENOUS | Status: AC
  Administered 2011-03-07: 500 [IU] via INTRAVENOUS

## 2011-03-07 NOTE — Progress Notes (Signed)
Roberto [name] presented for Portacath access and flush. Proper placement of portacath confirmed by CXR. Portacath located lt chest wall accessed with  H 20 needle. Good blood return present. Portacath flushed with 20ml NS and 500U/5ml Heparin and needle removed intact. Procedure without incident. Patient tolerated procedure well.   

## 2011-04-18 ENCOUNTER — Encounter (HOSPITAL_COMMUNITY): Payer: Medicare Other | Attending: Oncology

## 2011-04-18 DIAGNOSIS — C189 Malignant neoplasm of colon, unspecified: Secondary | ICD-10-CM | POA: Insufficient documentation

## 2011-04-18 DIAGNOSIS — C61 Malignant neoplasm of prostate: Secondary | ICD-10-CM | POA: Insufficient documentation

## 2011-04-18 LAB — COMPREHENSIVE METABOLIC PANEL
BUN: 11 mg/dL (ref 6–23)
CO2: 28 mEq/L (ref 19–32)
Chloride: 102 mEq/L (ref 96–112)
Creatinine, Ser: 0.93 mg/dL (ref 0.50–1.35)
GFR calc Af Amer: >60 mL/min (ref >60)
GFR calc non Af Amer: >60 mL/min (ref >60)
Glucose, Bld: 125 mg/dL — ABNORMAL HIGH (ref 70–99)
Total Bilirubin: 0.2 mg/dL — ABNORMAL LOW (ref 0.3–1.2)

## 2011-04-18 LAB — CBC
HCT: 44.7 % (ref 39.0–52.0)
Hemoglobin: 14.8 g/dL (ref 13.0–17.0)
MCH: 30 pg (ref 26.0–34.0)
MCHC: 33.1 g/dL (ref 30.0–36.0)
MCV: 90.5 fL (ref 78.0–100.0)
RBC: 4.94 MIL/uL (ref 4.22–5.81)
RDW: 13.8 % (ref 11.5–15.5)
WBC: 12.4 10*3/uL — ABNORMAL HIGH (ref 4.0–10.5)

## 2011-04-18 MED ORDER — SODIUM CHLORIDE 0.9 % IJ SOLN
INTRAMUSCULAR | Status: AC
  Filled 2011-04-18: qty 10

## 2011-04-18 MED ORDER — HEPARIN SOD (PORK) LOCK FLUSH 100 UNIT/ML IV SOLN
500.0000 [IU] | Freq: Once | INTRAVENOUS | Status: AC
  Administered 2011-04-18: 500 [IU] via INTRAVENOUS
  Filled 2011-04-18: qty 5

## 2011-04-18 MED ORDER — SODIUM CHLORIDE 0.9 % IJ SOLN
10.0000 mL | INTRAMUSCULAR | Status: DC | PRN
Start: 2011-04-18 — End: 2011-04-18
  Administered 2011-04-18: 10 mL via INTRAVENOUS
  Filled 2011-04-18: qty 10

## 2011-04-18 MED ORDER — HEPARIN SOD (PORK) LOCK FLUSH 100 UNIT/ML IV SOLN
INTRAVENOUS | Status: AC
  Filled 2011-04-18: qty 5

## 2011-04-18 NOTE — Progress Notes (Signed)
Roberto [name] presented for Portacath access and flush. Proper placement of portacath confirmed by CXR. Portacath located left chest wall accessed with  H 20 needle. Good blood return present. Portacath flushed with 20ml NS and 500U/5ml Heparin and needle removed intact. Procedure without incident. Patient tolerated procedure well.   

## 2011-05-30 ENCOUNTER — Encounter (HOSPITAL_COMMUNITY): Payer: Medicare Other | Attending: Oncology

## 2011-05-30 DIAGNOSIS — C189 Malignant neoplasm of colon, unspecified: Secondary | ICD-10-CM | POA: Insufficient documentation

## 2011-05-30 DIAGNOSIS — Z452 Encounter for adjustment and management of vascular access device: Secondary | ICD-10-CM

## 2011-05-30 MED ORDER — HEPARIN SOD (PORK) LOCK FLUSH 100 UNIT/ML IV SOLN
INTRAVENOUS | Status: AC
  Administered 2011-05-30: 500 [IU] via INTRAVENOUS
  Filled 2011-05-30: qty 5

## 2011-05-30 MED ORDER — SODIUM CHLORIDE 0.9 % IJ SOLN
10.0000 mL | INTRAMUSCULAR | Status: DC | PRN
  Administered 2011-05-30: 10 mL via INTRAVENOUS
  Filled 2011-05-30: qty 10

## 2011-05-30 MED ORDER — SODIUM CHLORIDE 0.9 % IJ SOLN
INTRAMUSCULAR | Status: AC
  Administered 2011-05-30: 10 mL via INTRAVENOUS
  Filled 2011-05-30: qty 10

## 2011-05-30 MED ORDER — HEPARIN SOD (PORK) LOCK FLUSH 100 UNIT/ML IV SOLN
500.0000 [IU] | Freq: Once | INTRAVENOUS | Status: AC
  Administered 2011-05-30: 500 [IU] via INTRAVENOUS
  Filled 2011-05-30: qty 5

## 2011-05-30 NOTE — Progress Notes (Signed)
}    Roberto [name] presented for Portacath access and flush. Proper placement of portacath confirmed by CXR. Portacath located left chest wall accessed with  #20 huber port} needle. Good BLOOD [id number]} Portacath flushed with 20ml NS and 500U/60ml Heparin and needle removed intact. Procedure without incident. Patient tolerated procedure well.   Procedure without incident. Patient tolerated procedure well.

## 2011-07-11 ENCOUNTER — Encounter (HOSPITAL_COMMUNITY): Payer: Medicare Other | Attending: Oncology

## 2011-07-11 DIAGNOSIS — C189 Malignant neoplasm of colon, unspecified: Secondary | ICD-10-CM | POA: Insufficient documentation

## 2011-07-11 DIAGNOSIS — Z452 Encounter for adjustment and management of vascular access device: Secondary | ICD-10-CM

## 2011-07-11 MED ORDER — HEPARIN SOD (PORK) LOCK FLUSH 100 UNIT/ML IV SOLN
INTRAVENOUS | Status: AC
  Administered 2011-07-11: 500 [IU] via INTRAVENOUS
  Filled 2011-07-11: qty 5

## 2011-07-11 MED ORDER — SODIUM CHLORIDE 0.9 % IJ SOLN
10.0000 mL | INTRAMUSCULAR | Status: DC | PRN
  Administered 2011-07-11: 10 mL via INTRAVENOUS
  Filled 2011-07-11: qty 10

## 2011-07-11 MED ORDER — SODIUM CHLORIDE 0.9 % IJ SOLN
INTRAMUSCULAR | Status: AC
  Administered 2011-07-11: 10 mL via INTRAVENOUS
  Filled 2011-07-11: qty 10

## 2011-07-11 MED ORDER — HEPARIN SOD (PORK) LOCK FLUSH 100 UNIT/ML IV SOLN
500.0000 [IU] | Freq: Once | INTRAVENOUS | Status: AC
  Administered 2011-07-11: 500 [IU] via INTRAVENOUS
  Filled 2011-07-11: qty 5

## 2011-07-11 NOTE — Progress Notes (Signed)
Roberto [name] presented for Portacath access and flush. Proper placement of portacath confirmed by CXR. Portacath located left chest wall accessed with  H 20 needle. Good blood return present. Portacath flushed with 20ml NS and 500U/5ml Heparin and needle removed intact. Procedure without incident. Patient tolerated procedure well.   

## 2011-08-22 ENCOUNTER — Encounter (HOSPITAL_COMMUNITY): Payer: Medicare Other | Attending: Oncology

## 2011-08-22 DIAGNOSIS — C61 Malignant neoplasm of prostate: Secondary | ICD-10-CM | POA: Insufficient documentation

## 2011-08-22 DIAGNOSIS — C189 Malignant neoplasm of colon, unspecified: Secondary | ICD-10-CM | POA: Insufficient documentation

## 2011-08-22 DIAGNOSIS — Z452 Encounter for adjustment and management of vascular access device: Secondary | ICD-10-CM

## 2011-08-22 LAB — DIFFERENTIAL
Eosinophils Relative: 3 % (ref 0–5)
Lymphocytes Relative: 34 % (ref 12–46)
Monocytes Absolute: 1.3 10*3/uL — ABNORMAL HIGH (ref 0.1–1.0)
Neutrophils Relative %: 53 % (ref 43–77)

## 2011-08-22 LAB — CBC
MCH: 29.5 pg (ref 26.0–34.0)
Platelets: 285 10*3/uL (ref 150–400)
RBC: 5.02 MIL/uL (ref 4.22–5.81)
WBC: 13.1 10*3/uL — ABNORMAL HIGH (ref 4.0–10.5)

## 2011-08-22 LAB — COMPREHENSIVE METABOLIC PANEL
ALT: 11 U/L (ref 0–53)
BUN: 14 mg/dL (ref 6–23)
Calcium: 9.6 mg/dL (ref 8.4–10.5)
GFR calc Af Amer: >90 mL/min (ref >90)
Glucose, Bld: 81 mg/dL (ref 70–99)
Sodium: 139 mEq/L (ref 135–145)
Total Protein: 7.1 g/dL (ref 6.0–8.3)

## 2011-08-22 MED ORDER — HEPARIN SOD (PORK) LOCK FLUSH 100 UNIT/ML IV SOLN
500.0000 [IU] | Freq: Once | INTRAVENOUS | Status: AC
  Administered 2011-08-22: 500 [IU] via INTRAVENOUS
  Filled 2011-08-22: qty 5

## 2011-08-22 MED ORDER — SODIUM CHLORIDE 0.9 % IJ SOLN
10.0000 mL | INTRAMUSCULAR | Status: DC | PRN
  Administered 2011-08-22: 10 mL via INTRAVENOUS
  Filled 2011-08-22: qty 10

## 2011-08-22 MED ORDER — HEPARIN SOD (PORK) LOCK FLUSH 100 UNIT/ML IV SOLN
INTRAVENOUS | Status: AC
  Administered 2011-08-22: 500 [IU] via INTRAVENOUS
  Filled 2011-08-22: qty 5

## 2011-08-22 MED ORDER — SODIUM CHLORIDE 0.9 % IJ SOLN
INTRAMUSCULAR | Status: AC
  Administered 2011-08-22: 10 mL via INTRAVENOUS
  Filled 2011-08-22: qty 10

## 2011-08-22 NOTE — Progress Notes (Signed)
Tolerated port flush well. Specimen obtained from port for labs.

## 2011-08-23 ENCOUNTER — Encounter (HOSPITAL_COMMUNITY): Payer: Self-pay | Admitting: Oncology

## 2011-08-23 ENCOUNTER — Encounter (HOSPITAL_BASED_OUTPATIENT_CLINIC_OR_DEPARTMENT_OTHER): Payer: Medicare Other | Admitting: Oncology

## 2011-08-23 DIAGNOSIS — C61 Malignant neoplasm of prostate: Secondary | ICD-10-CM

## 2011-08-23 DIAGNOSIS — G609 Hereditary and idiopathic neuropathy, unspecified: Secondary | ICD-10-CM

## 2011-08-23 DIAGNOSIS — D509 Iron deficiency anemia, unspecified: Secondary | ICD-10-CM

## 2011-08-23 DIAGNOSIS — C182 Malignant neoplasm of ascending colon: Secondary | ICD-10-CM

## 2011-08-23 DIAGNOSIS — C189 Malignant neoplasm of colon, unspecified: Secondary | ICD-10-CM

## 2011-08-23 NOTE — Progress Notes (Signed)
This office note has been dictated.

## 2011-08-23 NOTE — Progress Notes (Signed)
CC:   Dalia Heading, M.D. Dr. Riley Lam Shiflett [facility]  DIAGNOSES: 1. 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 2. Prostate cancer status post radical prostatectomy in October 2004     with a mildly rising PSA.  It was 0.25 when I first saw him in     August 2010.  It is now 0.66, almost identical to 0.68 in September     2012. 3. Chronic obstructive pulmonary disease, still smoking a pack a day     with markedly diminished breath sounds throughout, bluish     discoloration of his lips and fingernail beds. 4. Peripheral vascular disease. 5. 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 6. Varicose vein stripping in 1985 of the right leg. 7. Hypercholesterolemia. 8. 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 9. Partial resection of a thyroid issue in 1995 at [facility]. 10.Benign right upper lobe nodule with no change. 11.Iron deficiency anemia at the time of presentation with his colon     cancer.  HISTORY:  Roberto [name]'s hand from the trauma in December of 2011 is better. He is still working around the farm full-time, still fully active, not impaired in any way it sounds like, in spite of the feet which still are numb and they hurt after he has been on his feet all day, but by the next morning the only thing he has is numbness at the bottoms of his feet and the pain is gone.  I still worry that the smoking is affecting his feet as well as the oxaliplatin.  His fingers are doing quite well realistically and are asymptomatic. His most recent workup has included the CT scans of the chest and pelvis in  July and they showed no evidence of metastatic disease.  He was a little concerned today about his PSA that has increased gradually over the last 2 years and 3-4 months, but it is  not a major issue in my opinion.  I think his COPD and his colon cancer are still more problematic than his prostate cancer.  We need to continue to watch it but he does not need therapy at this time in my opinion.  Once he gets above 1, then we will consider a bone scan.  We are able to look at his bones, of course, with the CTs and the CTs are due in July.  Other than that, his lab work on the 15th was excellent including alkaline phosphatase which was normal, creatinine, electrolytes, liver enzymes, CBC.  His white count is still minimally elevated.  He does have a slight increase in lymphocytes but I do not think that is worth pursuing unless it continues to rise which it has not done lately.  EXAM:  Still shows no adenopathy, no thyromegaly.  He still has the bluish lips and bluish fingernail beds.  There is no true clubbing.  The lungs show markedly diminished breath sounds throughout.  Heart:  Shows a regular rhythm and rate.  Port is intact.  Abdomen:  Soft, nontender, without hepatosplenomegaly.  No masses and no peripheral edema.  So we will see him in 6 months but he will continue have CEAs every 3 months along with a PSA every 3 months.  We will get the CT scans in July.  I will see him right after that.    ______________________________ Ladona Horns. Mariel Sleet, MD ESN/MEDQ  D:  08/23/2011  T:  08/23/2011  Job:  [id number]

## 2011-08-23 NOTE — Patient Instructions (Signed)
Roberto [name]  [id number] October 26, 1940   [facility]  Discharge Instructions  RECOMMENDATIONS MADE BY THE CONSULTANT AND ANY TEST RESULTS WILL BE SENT TO YOUR REFERRING DOCTOR.   EXAM FINDINGS BY MD TODAY AND SIGNS AND SYMPTOMS TO REPORT TO CLINIC OR PRIMARY MD: You are doing great.  Will continue to monitor your CEA and PSA every 12 weeks.   Would be great if you could stop smoking, might help decrease the numbness and tingli  MEDICATIONS PRESCRIBED: none   INSTRUCTIONS GIVEN AND DISCUSSED: Other :  Report changes in bowel habits, blood in stool, bone pain or shortness of breath.  SPECIAL INSTRUCTIONS/FOLLOW-UP: Lab work Needed every 12 weeks, Xray Studies Needed CT scans in July, Return to Clinic to see MD after CT scans and Other (Referral/Appointments) port flushes every 6 weeks.   I acknowledge that I have been informed and understand all the instructions given to me and received a copy. I do not have any more questions at this time, but understand that I may call the Specialty Clinic at [facility] at [phone] during business hours should I have any further questions or need assistance in obtaining follow-up care.    __________________________________________  _____________  __________ Signature of Patient or Authorized Representative            Date                   Time    __________________________________________ Nurse's Signature

## 2011-10-03 ENCOUNTER — Encounter (HOSPITAL_COMMUNITY): Payer: Medicare Other | Attending: Oncology

## 2011-10-03 DIAGNOSIS — Z452 Encounter for adjustment and management of vascular access device: Secondary | ICD-10-CM

## 2011-10-03 DIAGNOSIS — C182 Malignant neoplasm of ascending colon: Secondary | ICD-10-CM

## 2011-10-03 DIAGNOSIS — C189 Malignant neoplasm of colon, unspecified: Secondary | ICD-10-CM | POA: Insufficient documentation

## 2011-10-03 MED ORDER — SODIUM CHLORIDE 0.9 % IJ SOLN
10.0000 mL | INTRAMUSCULAR | Status: DC | PRN
  Administered 2011-10-03: 10 mL via INTRAVENOUS
  Filled 2011-10-03: qty 10

## 2011-10-03 MED ORDER — HEPARIN SOD (PORK) LOCK FLUSH 100 UNIT/ML IV SOLN
INTRAVENOUS | Status: AC
  Administered 2011-10-03: 500 [IU] via INTRAVENOUS
  Filled 2011-10-03: qty 5

## 2011-10-03 MED ORDER — HEPARIN SOD (PORK) LOCK FLUSH 100 UNIT/ML IV SOLN
500.0000 [IU] | Freq: Once | INTRAVENOUS | Status: AC
  Administered 2011-10-03: 500 [IU] via INTRAVENOUS
  Filled 2011-10-03: qty 5

## 2011-10-03 NOTE — Progress Notes (Signed)
Osten V Bozza presented for Portacath access and flush. Proper placement of portacath confirmed by CXR. Portacath located left chest wall accessed with  H 20 needle. Good blood return present. Portacath flushed with 20ml NS and 500U/5ml Heparin and needle removed intact. Procedure without incident. Patient tolerated procedure well.   

## 2011-11-14 ENCOUNTER — Encounter (HOSPITAL_COMMUNITY): Payer: Medicare Other | Attending: Oncology

## 2011-11-14 DIAGNOSIS — C182 Malignant neoplasm of ascending colon: Secondary | ICD-10-CM

## 2011-11-14 DIAGNOSIS — C61 Malignant neoplasm of prostate: Secondary | ICD-10-CM | POA: Insufficient documentation

## 2011-11-14 DIAGNOSIS — C189 Malignant neoplasm of colon, unspecified: Secondary | ICD-10-CM | POA: Insufficient documentation

## 2011-11-14 MED ORDER — HEPARIN SOD (PORK) LOCK FLUSH 100 UNIT/ML IV SOLN
INTRAVENOUS | Status: AC
  Filled 2011-11-14: qty 5

## 2011-11-14 MED ORDER — SODIUM CHLORIDE 0.9 % IJ SOLN
INTRAMUSCULAR | Status: AC
  Filled 2011-11-14: qty 20

## 2011-11-14 MED ORDER — SODIUM CHLORIDE 0.9 % IJ SOLN
20.0000 mL | INTRAMUSCULAR | Status: DC | PRN
  Administered 2011-11-14: 20 mL via INTRAVENOUS
  Filled 2011-11-14: qty 20

## 2011-11-14 MED ORDER — HEPARIN SOD (PORK) LOCK FLUSH 100 UNIT/ML IV SOLN
500.0000 [IU] | Freq: Once | INTRAVENOUS | Status: AC
  Administered 2011-11-14: 500 [IU] via INTRAVENOUS
  Filled 2011-11-14: qty 5

## 2011-11-14 NOTE — Progress Notes (Signed)
Roberto [name] presented for Portacath access and flush. Proper placement of portacath confirmed by CXR. Portacath located right chest wall accessed with  H 20 needle. Good blood return present.  Specimen collected for labs.   Portacath flushed with 20ml NS and 500U/30ml Heparin and needle removed intact. Procedure without incident. Patient tolerated procedure well.

## 2011-11-15 LAB — CEA: CEA: 3.8 ng/mL (ref 0.0–5.0)

## 2011-12-26 ENCOUNTER — Encounter (HOSPITAL_COMMUNITY): Payer: Medicare Other | Attending: Oncology

## 2011-12-26 DIAGNOSIS — C189 Malignant neoplasm of colon, unspecified: Secondary | ICD-10-CM | POA: Insufficient documentation

## 2011-12-26 DIAGNOSIS — C182 Malignant neoplasm of ascending colon: Secondary | ICD-10-CM

## 2011-12-26 DIAGNOSIS — Z452 Encounter for adjustment and management of vascular access device: Secondary | ICD-10-CM

## 2011-12-26 MED ORDER — HEPARIN SOD (PORK) LOCK FLUSH 100 UNIT/ML IV SOLN
500.0000 [IU] | Freq: Once | INTRAVENOUS | Status: AC
  Administered 2011-12-26: 500 [IU] via INTRAVENOUS
  Filled 2011-12-26: qty 5

## 2011-12-26 MED ORDER — HEPARIN SOD (PORK) LOCK FLUSH 100 UNIT/ML IV SOLN
INTRAVENOUS | Status: AC
  Filled 2011-12-26: qty 5

## 2011-12-26 MED ORDER — SODIUM CHLORIDE 0.9 % IJ SOLN
10.0000 mL | Freq: Once | INTRAMUSCULAR | Status: AC
  Administered 2011-12-26: 10 mL via INTRAVENOUS
  Filled 2011-12-26: qty 10

## 2011-12-26 NOTE — Progress Notes (Signed)
Roberto [name] presented for Portacath access and flush. Proper placement of portacath confirmed by CXR. Portacath located lt chest wall accessed with  H 20 needle. Good blood return present. Portacath flushed with 20ml NS and 500U/5ml Heparin and needle removed intact. Procedure without incident. Patient tolerated procedure well.   

## 2012-02-06 ENCOUNTER — Encounter (HOSPITAL_COMMUNITY): Payer: Medicare Other | Attending: Oncology

## 2012-02-06 DIAGNOSIS — C189 Malignant neoplasm of colon, unspecified: Secondary | ICD-10-CM | POA: Insufficient documentation

## 2012-02-06 DIAGNOSIS — C61 Malignant neoplasm of prostate: Secondary | ICD-10-CM | POA: Insufficient documentation

## 2012-02-06 DIAGNOSIS — C182 Malignant neoplasm of ascending colon: Secondary | ICD-10-CM

## 2012-02-06 DIAGNOSIS — E876 Hypokalemia: Secondary | ICD-10-CM

## 2012-02-06 LAB — CBC
Platelets: 289 10*3/uL (ref 150–400)
RDW: 14.1 % (ref 11.5–15.5)
WBC: 12.1 10*3/uL — ABNORMAL HIGH (ref 4.0–10.5)

## 2012-02-06 LAB — COMPREHENSIVE METABOLIC PANEL
Albumin: 3.6 g/dL (ref 3.5–5.2)
Alkaline Phosphatase: 103 U/L (ref 39–117)
BUN: 14 mg/dL (ref 6–23)
Creatinine, Ser: 0.84 mg/dL (ref 0.50–1.35)
GFR calc Af Amer: >90 mL/min (ref >90)
Glucose, Bld: 99 mg/dL (ref 70–99)
Potassium: 3.2 mEq/L — ABNORMAL LOW (ref 3.5–5.1)
Total Protein: 7 g/dL (ref 6.0–8.3)

## 2012-02-06 LAB — DIFFERENTIAL
Basophils Relative: 0 % (ref 0–1)
Eosinophils Absolute: 0.1 10*3/uL (ref 0.0–0.7)
Eosinophils Relative: 1 % (ref 0–5)
Lymphocytes Relative: 36 % (ref 12–46)
Lymphs Abs: 4.4 10*3/uL — ABNORMAL HIGH (ref 0.7–4.0)
Monocytes Absolute: 1 10*3/uL (ref 0.1–1.0)
Monocytes Relative: 8 % (ref 3–12)
Neutro Abs: 6.6 10*3/uL (ref 1.7–7.7)

## 2012-02-06 MED ORDER — HEPARIN SOD (PORK) LOCK FLUSH 100 UNIT/ML IV SOLN
500.0000 [IU] | Freq: Once | INTRAVENOUS | Status: AC
  Administered 2012-02-06: 500 [IU] via INTRAVENOUS
  Filled 2012-02-06: qty 5

## 2012-02-06 MED ORDER — HEPARIN SOD (PORK) LOCK FLUSH 100 UNIT/ML IV SOLN
INTRAVENOUS | Status: AC
  Filled 2012-02-06: qty 5

## 2012-02-06 MED ORDER — SODIUM CHLORIDE 0.9 % IJ SOLN
INTRAMUSCULAR | Status: AC
  Filled 2012-02-06: qty 10

## 2012-02-06 MED ORDER — SODIUM CHLORIDE 0.9 % IJ SOLN
10.0000 mL | INTRAMUSCULAR | Status: DC | PRN
  Administered 2012-02-06: 10 mL via INTRAVENOUS
  Filled 2012-02-06: qty 10

## 2012-02-06 NOTE — Progress Notes (Signed)
Tolerated port flush well. 

## 2012-02-07 ENCOUNTER — Telehealth (HOSPITAL_COMMUNITY): Payer: Self-pay

## 2012-02-07 LAB — CEA: CEA: 3.5 ng/mL (ref 0.0–5.0)

## 2012-02-07 LAB — PSA: PSA: 0.72 ng/mL (ref <=4.00)

## 2012-02-07 MED ORDER — POTASSIUM CHLORIDE CRYS ER 20 MEQ PO TBCR: EXTENDED_RELEASE_TABLET | ORAL | Status: DC

## 2012-02-07 NOTE — Telephone Encounter (Signed)
Message copied by Evelena Leyden on Wed Feb 07, 2012  3:57 PM ------      Message from: Mariel Sleet, ERIC S      Created: Wed Feb 07, 2012  9:07 AM       Needs K+-does he have any??      ----- Message -----         From: Lab [facility] [place] Interface         Sent: 02/06/2012   3:29 PM           To: Randall An, MD

## 2012-02-07 NOTE — Addendum Note (Signed)
Addended by: Evelena Leyden on: 02/07/2012 03:56 PM   Modules accepted: Orders

## 2012-02-07 NOTE — Telephone Encounter (Signed)
Not on any potassium per patient.  Discussed with Dr. Mariel Sleet and prescription for Kdur 20 mEq  #100 to take 1 twice daily for 7 days then 1 daily called in to [facility] in [place] [phone]  Patient notified and verbalized understanding.

## 2012-02-22 ENCOUNTER — Ambulatory Visit (HOSPITAL_COMMUNITY)
Admission: RE | Admit: 2012-02-22 | Discharge: 2012-02-22 | Disposition: A | Discharge status: Home / Self Care | Payer: Medicare Other | Source: Ambulatory Visit | Attending: Oncology | Admitting: Oncology

## 2012-02-22 ENCOUNTER — Other Ambulatory Visit (HOSPITAL_COMMUNITY): Payer: Medicare Other

## 2012-02-22 DIAGNOSIS — R091 Pleurisy: Secondary | ICD-10-CM | POA: Insufficient documentation

## 2012-02-22 DIAGNOSIS — R911 Solitary pulmonary nodule: Secondary | ICD-10-CM | POA: Insufficient documentation

## 2012-02-22 DIAGNOSIS — Z9079 Acquired absence of other genital organ(s): Secondary | ICD-10-CM | POA: Insufficient documentation

## 2012-02-22 DIAGNOSIS — F17200 Nicotine dependence, unspecified, uncomplicated: Secondary | ICD-10-CM | POA: Insufficient documentation

## 2012-02-22 DIAGNOSIS — C189 Malignant neoplasm of colon, unspecified: Secondary | ICD-10-CM | POA: Insufficient documentation

## 2012-02-22 DIAGNOSIS — J438 Other emphysema: Secondary | ICD-10-CM | POA: Insufficient documentation

## 2012-02-22 MED ORDER — IOHEXOL 300 MG/ML  SOLN
100.0000 mL | Freq: Once | INTRAMUSCULAR | Status: AC | PRN
  Administered 2012-02-22: 100 mL via INTRAVENOUS

## 2012-02-26 ENCOUNTER — Encounter (HOSPITAL_COMMUNITY): Payer: Medicare Other | Attending: Oncology | Admitting: Oncology

## 2012-02-26 VITALS — BP 156/79 | HR 97 | Temp 98.5°F | Wt 188.8 lb

## 2012-02-26 DIAGNOSIS — J449 Chronic obstructive pulmonary disease, unspecified: Secondary | ICD-10-CM

## 2012-02-26 DIAGNOSIS — C61 Malignant neoplasm of prostate: Secondary | ICD-10-CM

## 2012-02-26 DIAGNOSIS — C189 Malignant neoplasm of colon, unspecified: Secondary | ICD-10-CM

## 2012-02-26 DIAGNOSIS — C182 Malignant neoplasm of ascending colon: Secondary | ICD-10-CM

## 2012-02-26 DIAGNOSIS — G620 Drug-induced polyneuropathy: Secondary | ICD-10-CM

## 2012-02-26 NOTE — Patient Instructions (Addendum)
Roberto [name]  [id number] Jun 06, 1941 Dr. Glenford Peers   [facility]  Discharge Instructions  RECOMMENDATIONS MADE BY THE CONSULTANT AND ANY TEST RESULTS WILL BE SENT TO YOUR REFERRING DOCTOR.   EXAM FINDINGS BY MD TODAY AND SIGNS AND SYMPTOMS TO REPORT TO CLINIC OR PRIMARY MD: you are doing well.  Blood work is stable.  MEDICATIONS PRESCRIBED: Increase your potassium to 1 pill twice daily. - your potassium level is too low.   INSTRUCTIONS GIVEN AND DISCUSSED: Other:  Report any new lumps, bone pain or shortness of breath, changes in bowel habits, blood in your bowel movement, etc.  SPECIAL INSTRUCTIONS/FOLLOW-UP: Lab work Needed in 6 months and Return to Clinic on after labs in 6 months.   I acknowledge that I have been informed and understand all the instructions given to me and received a copy. I do not have any more questions at this time, but understand that I may call the Specialty Clinic at [facility] at [phone] during business hours should I have any further questions or need assistance in obtaining follow-up care.    __________________________________________  _____________  __________ Signature of Patient or Authorized Representative            Date                   Time    __________________________________________ Nurse's Signature

## 2012-02-26 NOTE — Progress Notes (Signed)
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 and once the M. asked) intermediate grade cancer with surgery on 02/22/2009 followed by 6 cycles of oxaliplatinum and capecitabine finishing all therapy as of 07/21/2009 thus far without recurrence. His CAT scans of the day were negative and his CEA remains well within the normal range.  Problem #2 prostate cancer with a radical prostatectomy in October 2004 and a very slow rising PSA which has not doubled in over 24 months. It is still under 1 and we will continue to watch him. His bone windows of his CAT scans did not show obvious metastatic lesions either.  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  Problem #4 left-sided buttocks and thigh and foot pain consistent with degenerative disc disease not hurting and whatsoever for the last 3 weeks he states.  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 Problem #6 COPD still smoking his PSA is 0.72 up ever so slightly from April but it has not doubled even in 2 years time.  His CEA remains well within the normal range. His CAT scans of the chest abdomen and pelvis showed no recurrent disease and no new disease such as a lung lesion and we did his a CT of the chest because of his very very long smoking history.  I went over the results with him. I still do not think we need to treat his prostate cancer which I suspect is starting to come back. We will monitor his CEAs every 3 months and his PSAs every 3 months and I will see him in 6 months and will do scans again in a year. He remains fully functional  .

## 2012-03-19 ENCOUNTER — Encounter (HOSPITAL_COMMUNITY): Payer: Medicare Other | Attending: Oncology

## 2012-03-19 DIAGNOSIS — C189 Malignant neoplasm of colon, unspecified: Secondary | ICD-10-CM

## 2012-03-19 DIAGNOSIS — C182 Malignant neoplasm of ascending colon: Secondary | ICD-10-CM

## 2012-03-19 DIAGNOSIS — Z452 Encounter for adjustment and management of vascular access device: Secondary | ICD-10-CM

## 2012-03-19 DIAGNOSIS — C61 Malignant neoplasm of prostate: Secondary | ICD-10-CM

## 2012-03-19 MED ORDER — HEPARIN SOD (PORK) LOCK FLUSH 100 UNIT/ML IV SOLN
500.0000 [IU] | Freq: Once | INTRAVENOUS | Status: AC
  Administered 2012-03-19: 500 [IU] via INTRAVENOUS
  Filled 2012-03-19: qty 5

## 2012-03-19 MED ORDER — SODIUM CHLORIDE 0.9 % IJ SOLN
10.0000 mL | Freq: Once | INTRAMUSCULAR | Status: AC
  Administered 2012-03-19: 10 mL via INTRAVENOUS
  Filled 2012-03-19: qty 10

## 2012-03-19 MED ORDER — HEPARIN SOD (PORK) LOCK FLUSH 100 UNIT/ML IV SOLN
INTRAVENOUS | Status: AC
  Filled 2012-03-19: qty 5

## 2012-03-19 NOTE — Progress Notes (Signed)
Roberto [name] presented for Portacath access and flush. Proper placement of portacath confirmed by CXR. Portacath located  Lt chest wall accessed with  H 20 needle. Good blood return present. Portacath flushed with 20ml NS and 500U/25ml Heparin and needle removed intact. Procedure without incident. Patient tolerated procedure well.

## 2012-03-19 NOTE — Progress Notes (Signed)
Refill called in for EMLA cream (prn refills)

## 2012-04-23 ENCOUNTER — Encounter (HOSPITAL_COMMUNITY): Payer: Medicare Other

## 2012-04-30 ENCOUNTER — Encounter (HOSPITAL_COMMUNITY): Payer: Medicare Other | Attending: Oncology

## 2012-04-30 DIAGNOSIS — C189 Malignant neoplasm of colon, unspecified: Secondary | ICD-10-CM

## 2012-04-30 DIAGNOSIS — C61 Malignant neoplasm of prostate: Secondary | ICD-10-CM

## 2012-04-30 DIAGNOSIS — C182 Malignant neoplasm of ascending colon: Secondary | ICD-10-CM

## 2012-04-30 DIAGNOSIS — Z452 Encounter for adjustment and management of vascular access device: Secondary | ICD-10-CM

## 2012-04-30 MED ORDER — SODIUM CHLORIDE 0.9 % IJ SOLN
INTRAMUSCULAR | Status: AC
  Filled 2012-04-30: qty 20

## 2012-04-30 MED ORDER — SODIUM CHLORIDE 0.9 % IJ SOLN
20.0000 mL | INTRAMUSCULAR | Status: DC | PRN
  Administered 2012-04-30: 20 mL via INTRAVENOUS
  Filled 2012-04-30: qty 20

## 2012-04-30 MED ORDER — HEPARIN SOD (PORK) LOCK FLUSH 100 UNIT/ML IV SOLN
500.0000 [IU] | Freq: Once | INTRAVENOUS | Status: AC
  Administered 2012-04-30: 500 [IU] via INTRAVENOUS
  Filled 2012-04-30: qty 5

## 2012-04-30 MED ORDER — HEPARIN SOD (PORK) LOCK FLUSH 100 UNIT/ML IV SOLN
INTRAVENOUS | Status: AC
  Filled 2012-04-30: qty 5

## 2012-04-30 NOTE — Progress Notes (Signed)
Roberto [name] presented for Portacath access and flush. Proper placement of portacath confirmed by CXR. Portacath located left chest wall accessed with  H 20 needle. Good blood return present.  Specimen drawn for lab.   Portacath flushed with 20ml NS and 500U/89ml Heparin and needle removed intact. Procedure without incident. Patient tolerated procedure well.

## 2012-05-01 LAB — PSA: PSA: 0.88 ng/mL (ref <=4.00)

## 2012-06-11 ENCOUNTER — Encounter (HOSPITAL_COMMUNITY): Payer: Medicare Other | Attending: Oncology

## 2012-06-11 DIAGNOSIS — C182 Malignant neoplasm of ascending colon: Secondary | ICD-10-CM

## 2012-06-11 DIAGNOSIS — C189 Malignant neoplasm of colon, unspecified: Secondary | ICD-10-CM

## 2012-06-11 DIAGNOSIS — Z452 Encounter for adjustment and management of vascular access device: Secondary | ICD-10-CM

## 2012-06-11 DIAGNOSIS — C61 Malignant neoplasm of prostate: Secondary | ICD-10-CM

## 2012-06-11 MED ORDER — SODIUM CHLORIDE 0.9 % IJ SOLN
INTRAMUSCULAR | Status: AC
  Filled 2012-06-11: qty 10

## 2012-06-11 MED ORDER — HEPARIN SOD (PORK) LOCK FLUSH 100 UNIT/ML IV SOLN
INTRAVENOUS | Status: AC
  Filled 2012-06-11: qty 5

## 2012-06-11 MED ORDER — SODIUM CHLORIDE 0.9 % IJ SOLN
10.0000 mL | INTRAMUSCULAR | Status: DC | PRN
  Administered 2012-06-11: 10 mL via INTRAVENOUS
  Filled 2012-06-11: qty 10

## 2012-06-11 MED ORDER — HEPARIN SOD (PORK) LOCK FLUSH 100 UNIT/ML IV SOLN
500.0000 [IU] | Freq: Once | INTRAVENOUS | Status: AC
  Administered 2012-06-11: 500 [IU] via INTRAVENOUS
  Filled 2012-06-11: qty 5

## 2012-06-11 NOTE — Progress Notes (Signed)
Tolerated port fluish well.  Good blood return.

## 2012-07-16 ENCOUNTER — Encounter (HOSPITAL_COMMUNITY): Payer: Medicare Other

## 2012-07-23 ENCOUNTER — Encounter (HOSPITAL_COMMUNITY): Payer: Medicare Other | Attending: Oncology

## 2012-07-23 DIAGNOSIS — Z9889 Other specified postprocedural states: Secondary | ICD-10-CM | POA: Insufficient documentation

## 2012-07-23 DIAGNOSIS — C61 Malignant neoplasm of prostate: Secondary | ICD-10-CM

## 2012-07-23 DIAGNOSIS — C182 Malignant neoplasm of ascending colon: Secondary | ICD-10-CM

## 2012-07-23 DIAGNOSIS — Z452 Encounter for adjustment and management of vascular access device: Secondary | ICD-10-CM

## 2012-07-23 DIAGNOSIS — Z95828 Presence of other vascular implants and grafts: Secondary | ICD-10-CM

## 2012-07-23 MED ORDER — SODIUM CHLORIDE 0.9 % IJ SOLN
INTRAMUSCULAR | Status: AC
  Filled 2012-07-23: qty 10

## 2012-07-23 MED ORDER — SODIUM CHLORIDE 0.9 % IJ SOLN
10.0000 mL | INTRAMUSCULAR | Status: DC | PRN
  Administered 2012-07-23: 10 mL via INTRAVENOUS
  Filled 2012-07-23: qty 10

## 2012-07-23 MED ORDER — HEPARIN SOD (PORK) LOCK FLUSH 100 UNIT/ML IV SOLN
INTRAVENOUS | Status: AC
  Filled 2012-07-23: qty 5

## 2012-07-23 MED ORDER — HEPARIN SOD (PORK) LOCK FLUSH 100 UNIT/ML IV SOLN
500.0000 [IU] | Freq: Once | INTRAVENOUS | Status: AC
  Administered 2012-07-23: 500 [IU] via INTRAVENOUS
  Filled 2012-07-23: qty 5

## 2012-07-23 NOTE — Progress Notes (Signed)
Roberto [name] presented for Portacath access and flush. Proper placement of portacath confirmed by CXR. Portacath located lt chest wall accessed with  H 20 needle. Good blood return present. Portacath flushed with 20ml NS and 500U/8ml Heparin and needle removed intact. Procedure without incident. Patient tolerated procedure well.

## 2012-08-12 ENCOUNTER — Ambulatory Visit (HOSPITAL_COMMUNITY): Payer: Medicare Other | Admitting: Oncology

## 2012-08-27 ENCOUNTER — Encounter (HOSPITAL_COMMUNITY): Payer: Medicare Other | Attending: Oncology

## 2012-08-27 DIAGNOSIS — C189 Malignant neoplasm of colon, unspecified: Secondary | ICD-10-CM | POA: Insufficient documentation

## 2012-08-27 DIAGNOSIS — C61 Malignant neoplasm of prostate: Secondary | ICD-10-CM | POA: Insufficient documentation

## 2012-08-27 DIAGNOSIS — Z452 Encounter for adjustment and management of vascular access device: Secondary | ICD-10-CM

## 2012-08-27 DIAGNOSIS — C182 Malignant neoplasm of ascending colon: Secondary | ICD-10-CM

## 2012-08-27 LAB — COMPREHENSIVE METABOLIC PANEL
ALT: 9 U/L (ref 0–53)
AST: 13 U/L (ref 0–37)
Albumin: 3.6 g/dL (ref 3.5–5.2)
CO2: 29 mEq/L (ref 19–32)
Chloride: 99 mEq/L (ref 96–112)
GFR calc non Af Amer: 88 mL/min — ABNORMAL LOW (ref >90)
Sodium: 135 mEq/L (ref 135–145)
Total Bilirubin: 0.2 mg/dL — ABNORMAL LOW (ref 0.3–1.2)

## 2012-08-27 LAB — DIFFERENTIAL
Basophils Absolute: 0 10*3/uL (ref 0.0–0.1)
Basophils Relative: 0 % (ref 0–1)
Lymphocytes Relative: 38 % (ref 12–46)
Neutro Abs: 5.7 10*3/uL (ref 1.7–7.7)
Neutrophils Relative %: 50 % (ref 43–77)

## 2012-08-27 LAB — CBC
Hemoglobin: 14.8 g/dL (ref 13.0–17.0)
MCH: 30 pg (ref 26.0–34.0)
RBC: 4.93 MIL/uL (ref 4.22–5.81)

## 2012-08-27 MED ORDER — SODIUM CHLORIDE 0.9 % IJ SOLN
10.0000 mL | INTRAMUSCULAR | Status: DC | PRN
  Administered 2012-08-27: 10 mL via INTRAVENOUS
  Filled 2012-08-27: qty 10

## 2012-08-27 MED ORDER — HEPARIN SOD (PORK) LOCK FLUSH 100 UNIT/ML IV SOLN
INTRAVENOUS | Status: AC
  Filled 2012-08-27: qty 5

## 2012-08-27 MED ORDER — HEPARIN SOD (PORK) LOCK FLUSH 100 UNIT/ML IV SOLN
500.0000 [IU] | Freq: Once | INTRAVENOUS | Status: AC
  Administered 2012-08-27: 500 [IU] via INTRAVENOUS
  Filled 2012-08-27: qty 5

## 2012-08-27 NOTE — Progress Notes (Signed)
Roberto [name] presented for Portacath access with flush and labs.  Proper placement of portacath confirmed by CXR.  Portacath located left chest wall accessed with  H 20 needle.  Good blood return present. Portacath flushed with 20ml NS and 500U/79ml Heparin and needle removed intact.  Procedure tolerated well and without incident.

## 2012-08-28 ENCOUNTER — Encounter (HOSPITAL_BASED_OUTPATIENT_CLINIC_OR_DEPARTMENT_OTHER): Payer: Medicare Other | Admitting: Oncology

## 2012-08-28 ENCOUNTER — Encounter (HOSPITAL_COMMUNITY): Payer: Self-pay | Admitting: Oncology

## 2012-08-28 VITALS — BP 138/59 | HR 107 | Temp 98.4°F | Resp 18 | Wt 191.2 lb

## 2012-08-28 DIAGNOSIS — J449 Chronic obstructive pulmonary disease, unspecified: Secondary | ICD-10-CM

## 2012-08-28 DIAGNOSIS — Z85038 Personal history of other malignant neoplasm of large intestine: Secondary | ICD-10-CM

## 2012-08-28 DIAGNOSIS — C189 Malignant neoplasm of colon, unspecified: Secondary | ICD-10-CM

## 2012-08-28 DIAGNOSIS — C61 Malignant neoplasm of prostate: Secondary | ICD-10-CM

## 2012-08-28 DIAGNOSIS — Z8546 Personal history of malignant neoplasm of prostate: Secondary | ICD-10-CM

## 2012-08-28 DIAGNOSIS — F17200 Nicotine dependence, unspecified, uncomplicated: Secondary | ICD-10-CM

## 2012-08-28 NOTE — Patient Instructions (Addendum)
[facility] Discharge Instructions  RECOMMENDATIONS MADE BY THE CONSULTANT AND ANY TEST RESULTS WILL BE SENT TO YOUR REFERRING PHYSICIAN.  EXAM FINDINGS BY THE PHYSICIAN TODAY AND SIGNS OR SYMPTOMS TO REPORT TO CLINIC OR PRIMARY PHYSICIAN: exam and discussion by MD.  Bonita Quin are doing well.  Blood work is stable.  You need to stop smoking.  MEDICATIONS PRESCRIBED:  none  INSTRUCTIONS GIVEN AND DISCUSSED: Report changes in bowel habits, blood in your stool, any new lumps or bone pain.  SPECIAL INSTRUCTIONS/FOLLOW-UP: Blood work, CT scans of your chest, abdomen and pelvis and bone scan in July.  To be seen in follow-up after scans.  Thank you for choosing [facility] to provide your oncology and hematology care.  To afford each patient quality time with our providers, please arrive at least 15 minutes before your scheduled appointment time.  With your help, our goal is to use those 15 minutes to complete the necessary work-up to ensure our physicians have the information they need to help with your evaluation and healthcare recommendations.    Effective January 1st, 2014, we ask that you re-schedule your appointment with our physicians should you arrive 10 or more minutes late for your appointment.  We strive to give you quality time with our providers, and arriving late affects you and other patients whose appointments are after yours.    Again, thank you for choosing [facility].  Our hope is that these requests will decrease the amount of time that you wait before being seen by our physicians.       _____________________________________________________________  Should you have questions after your visit to [facility], please contact our office at [phone] between the hours of 8:30 a.m. and 5:00 p.m.  Voicemails left after 4:30 p.m. will not be returned until the following business day.  For prescription refill requests, have your  pharmacy contact our office with your prescription refill request.

## 2012-08-28 NOTE — Progress Notes (Signed)
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 It was 0.25 in August 2010 and has gradually risen to 0.88. Problem #3 COPD still smoking approximately pack a day with very very diminished breath sounds throughout and bluish discoloration of his lips and fingernail beds. I have asked him once again to please quit smoking. 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 He tells me his wife had a small stroke with 99% carotid artery occlusion that was fixed at [employer] sometime in the fall of 2013. She is doing much better. He himself remains fully active. He does have an ED and I suspect this is a complication of vascular disease. He has never had radiation therapy to the pelvis.  Bowels are working well. Appetite is excellent. In the rest of the oncology review of systems is negative. He does not have bone pain etc. His lacerated left thumb that was repaired is back to normal he states.  Vital signs are stable. Lymph nodes remain negative throughout. Lungs remain hyperresonant to percussion with markedly decreased breath sounds. Heart shows a regular rhythm and rate without murmur rub or gallop. He has a very hyperemic appearing skin color but bluish lips and nail beds.  Abdomen remains soft and nontender with normal bowel sounds. There is no hepatosplenomegaly. He has no leg edema no arm edema.  He will need a bone scan, CAT scans, and lab work in July and I will see him right after that. If his PSA rises above 1, we could initiate Depo-Lupron if he so desires or if we scan him in find nothing has metastasized we could get a radiation therapy consultation. He is somewhat leery of radiation therapy. So we will see what we find in July. The good news is his PSA is not doubling at a rapid rate whatsoever. Besides not receiving radiation therapy ever he has never had Depo-Lupron or any other hormonal therapy

## 2012-10-08 ENCOUNTER — Encounter (HOSPITAL_COMMUNITY): Payer: Medicare Other | Attending: Oncology

## 2012-10-08 DIAGNOSIS — C61 Malignant neoplasm of prostate: Secondary | ICD-10-CM | POA: Insufficient documentation

## 2012-10-08 DIAGNOSIS — C189 Malignant neoplasm of colon, unspecified: Secondary | ICD-10-CM | POA: Insufficient documentation

## 2012-10-08 MED ORDER — HEPARIN SOD (PORK) LOCK FLUSH 100 UNIT/ML IV SOLN
INTRAVENOUS | Status: AC
  Filled 2012-10-08: qty 5

## 2012-10-08 MED ORDER — HEPARIN SOD (PORK) LOCK FLUSH 100 UNIT/ML IV SOLN
500.0000 [IU] | Freq: Once | INTRAVENOUS | Status: AC
  Administered 2012-10-08: 500 [IU] via INTRAVENOUS
  Filled 2012-10-08: qty 5

## 2012-10-08 MED ORDER — SODIUM CHLORIDE 0.9 % IJ SOLN
10.0000 mL | INTRAMUSCULAR | Status: DC | PRN
  Administered 2012-10-08: 10 mL via INTRAVENOUS
  Filled 2012-10-08: qty 10

## 2012-10-08 NOTE — Progress Notes (Signed)
Roberto [name] presented for Portacath access and flush.  Proper placement of portacath confirmed by CXR.  Portacath located left chest wall accessed with  H 20 needle.  Good blood return present. Portacath flushed with 20ml NS and 500U/28ml Heparin and needle removed intact.  Procedure tolerated well and without incident.  Next port flush appt scheduled to coincide w/ lab appt.

## 2012-11-25 ENCOUNTER — Other Ambulatory Visit (HOSPITAL_COMMUNITY): Payer: Medicare Other

## 2012-11-25 ENCOUNTER — Encounter (HOSPITAL_COMMUNITY): Payer: Self-pay | Admitting: *Deleted

## 2012-11-25 ENCOUNTER — Encounter (HOSPITAL_COMMUNITY): Payer: Medicare Other | Attending: Oncology

## 2012-11-25 DIAGNOSIS — C189 Malignant neoplasm of colon, unspecified: Secondary | ICD-10-CM | POA: Insufficient documentation

## 2012-11-25 DIAGNOSIS — C182 Malignant neoplasm of ascending colon: Secondary | ICD-10-CM

## 2012-11-25 DIAGNOSIS — Z452 Encounter for adjustment and management of vascular access device: Secondary | ICD-10-CM

## 2012-11-25 MED ORDER — SODIUM CHLORIDE 0.9 % IJ SOLN
10.0000 mL | INTRAMUSCULAR | Status: DC | PRN
  Administered 2012-11-25: 10 mL via INTRAVENOUS
  Filled 2012-11-25: qty 10

## 2012-11-25 MED ORDER — HEPARIN SOD (PORK) LOCK FLUSH 100 UNIT/ML IV SOLN
500.0000 [IU] | Freq: Once | INTRAVENOUS | Status: AC
  Administered 2012-11-25: 500 [IU] via INTRAVENOUS
  Filled 2012-11-25: qty 5

## 2012-11-25 MED ORDER — HEPARIN SOD (PORK) LOCK FLUSH 100 UNIT/ML IV SOLN
INTRAVENOUS | Status: AC
  Filled 2012-11-25: qty 5

## 2012-11-25 NOTE — Progress Notes (Signed)
Delena Bali presented for Portacath access and flush. Proper placement of portacath confirmed by CXR. Portacath located lt chest wall accessed with  H 20 needle. Good blood return present. Portacath flushed with 20ml NS and 500U/11ml Heparin and needle removed intact. Procedure without incident. Patient tolerated procedure well.

## 2013-01-06 ENCOUNTER — Encounter (HOSPITAL_COMMUNITY): Payer: Medicare Other | Attending: Oncology

## 2013-01-06 DIAGNOSIS — Z9889 Other specified postprocedural states: Secondary | ICD-10-CM | POA: Insufficient documentation

## 2013-01-06 DIAGNOSIS — C182 Malignant neoplasm of ascending colon: Secondary | ICD-10-CM

## 2013-01-06 DIAGNOSIS — Z452 Encounter for adjustment and management of vascular access device: Secondary | ICD-10-CM

## 2013-01-06 DIAGNOSIS — Z95828 Presence of other vascular implants and grafts: Secondary | ICD-10-CM

## 2013-01-06 MED ORDER — SODIUM CHLORIDE 0.9 % IJ SOLN
10.0000 mL | INTRAMUSCULAR | Status: DC | PRN
  Administered 2013-01-06: 10 mL via INTRAVENOUS
  Filled 2013-01-06: qty 10

## 2013-01-06 MED ORDER — HEPARIN SOD (PORK) LOCK FLUSH 100 UNIT/ML IV SOLN
500.0000 [IU] | Freq: Once | INTRAVENOUS | Status: AC
  Administered 2013-01-06: 500 [IU] via INTRAVENOUS
  Filled 2013-01-06: qty 5

## 2013-01-06 MED ORDER — HEPARIN SOD (PORK) LOCK FLUSH 100 UNIT/ML IV SOLN
INTRAVENOUS | Status: AC
  Filled 2013-01-06: qty 5

## 2013-01-06 NOTE — Progress Notes (Signed)
Roberto [name] presented for Portacath access and flush. Proper placement of portacath confirmed by CXR. Portacath located lt chest wall accessed with  H 20 needle. Good blood return present. Portacath flushed with 20ml NS and 500U/5ml Heparin and needle removed intact. Procedure without incident. Patient tolerated procedure well.   

## 2013-02-19 ENCOUNTER — Encounter (HOSPITAL_COMMUNITY): Payer: Medicare Other | Attending: Oncology

## 2013-02-19 ENCOUNTER — Other Ambulatory Visit (HOSPITAL_COMMUNITY): Payer: Medicare Other

## 2013-02-19 DIAGNOSIS — Z9889 Other specified postprocedural states: Secondary | ICD-10-CM

## 2013-02-19 DIAGNOSIS — Z95828 Presence of other vascular implants and grafts: Secondary | ICD-10-CM

## 2013-02-19 DIAGNOSIS — C189 Malignant neoplasm of colon, unspecified: Secondary | ICD-10-CM

## 2013-02-19 DIAGNOSIS — C61 Malignant neoplasm of prostate: Secondary | ICD-10-CM | POA: Insufficient documentation

## 2013-02-19 LAB — COMPREHENSIVE METABOLIC PANEL
ALT: 11 U/L (ref 0–53)
BUN: 11 mg/dL (ref 6–23)
Calcium: 9.6 mg/dL (ref 8.4–10.5)
GFR calc Af Amer: >90 mL/min (ref >90)
Glucose, Bld: 99 mg/dL (ref 70–99)
Sodium: 134 mEq/L — ABNORMAL LOW (ref 135–145)
Total Protein: 7.7 g/dL (ref 6.0–8.3)

## 2013-02-19 LAB — CBC WITH DIFFERENTIAL/PLATELET
Eosinophils Absolute: 0.1 10*3/uL (ref 0.0–0.7)
Eosinophils Relative: 1 % (ref 0–5)
Lymphs Abs: 4.1 10*3/uL — ABNORMAL HIGH (ref 0.7–4.0)
MCH: 30.6 pg (ref 26.0–34.0)
MCV: 89.7 fL (ref 78.0–100.0)
Platelets: 304 10*3/uL (ref 150–400)
RBC: 5.07 MIL/uL (ref 4.22–5.81)

## 2013-02-19 MED ORDER — SODIUM CHLORIDE 0.9 % IJ SOLN
10.0000 mL | INTRAMUSCULAR | Status: DC | PRN
  Administered 2013-02-19: 10 mL via INTRAVENOUS
  Filled 2013-02-19: qty 10

## 2013-02-19 MED ORDER — HEPARIN SOD (PORK) LOCK FLUSH 100 UNIT/ML IV SOLN
500.0000 [IU] | Freq: Once | INTRAVENOUS | Status: AC
  Administered 2013-02-19: 500 [IU] via INTRAVENOUS
  Filled 2013-02-19: qty 5

## 2013-02-19 MED ORDER — HEPARIN SOD (PORK) LOCK FLUSH 100 UNIT/ML IV SOLN
INTRAVENOUS | Status: AC
  Filled 2013-02-19: qty 5

## 2013-02-19 NOTE — Progress Notes (Signed)
Roberto [name] presented for Portacath access and flush.  Proper placement of portacath confirmed by CXR.  Portacath located left chest wall accessed with  H 20 needle.  Good blood return present. Portacath flushed with 20ml NS and 500U/5ml Heparin and needle removed intact.  Procedure tolerated well and without incident.    

## 2013-02-20 LAB — CEA: CEA: 3.6 ng/mL (ref 0.0–5.0)

## 2013-02-20 LAB — PSA: PSA: 0.94 ng/mL (ref <=4.00)

## 2013-02-21 ENCOUNTER — Other Ambulatory Visit (HOSPITAL_COMMUNITY): Payer: Medicare Other

## 2013-02-24 ENCOUNTER — Ambulatory Visit (HOSPITAL_COMMUNITY)
Admission: RE | Admit: 2013-02-24 | Discharge: 2013-02-24 | Disposition: A | Discharge status: Home / Self Care | Payer: Medicare Other | Source: Ambulatory Visit | Attending: Oncology | Admitting: Oncology

## 2013-02-24 ENCOUNTER — Ambulatory Visit (HOSPITAL_COMMUNITY): Payer: Medicare Other

## 2013-02-24 ENCOUNTER — Other Ambulatory Visit (HOSPITAL_COMMUNITY): Payer: Medicare Other

## 2013-02-24 ENCOUNTER — Ambulatory Visit (HOSPITAL_COMMUNITY): Payer: Medicare Other | Admitting: Oncology

## 2013-02-24 DIAGNOSIS — R911 Solitary pulmonary nodule: Secondary | ICD-10-CM | POA: Insufficient documentation

## 2013-02-24 DIAGNOSIS — K5730 Diverticulosis of large intestine without perforation or abscess without bleeding: Secondary | ICD-10-CM | POA: Insufficient documentation

## 2013-02-24 DIAGNOSIS — C189 Malignant neoplasm of colon, unspecified: Secondary | ICD-10-CM | POA: Insufficient documentation

## 2013-02-24 DIAGNOSIS — Z8546 Personal history of malignant neoplasm of prostate: Secondary | ICD-10-CM | POA: Insufficient documentation

## 2013-02-24 DIAGNOSIS — C61 Malignant neoplasm of prostate: Secondary | ICD-10-CM

## 2013-02-24 MED ORDER — IOHEXOL 300 MG/ML  SOLN
100.0000 mL | Freq: Once | INTRAMUSCULAR | Status: AC | PRN
  Administered 2013-02-24: 100 mL via INTRAVENOUS

## 2013-02-25 ENCOUNTER — Encounter (HOSPITAL_COMMUNITY)
Admission: RE | Admit: 2013-02-25 | Discharge: 2013-02-25 | Disposition: A | Discharge status: Home / Self Care | Payer: Medicare Other | Source: Ambulatory Visit | Attending: Oncology | Admitting: Oncology

## 2013-02-25 ENCOUNTER — Encounter (HOSPITAL_COMMUNITY): Payer: Self-pay

## 2013-02-25 DIAGNOSIS — C61 Malignant neoplasm of prostate: Secondary | ICD-10-CM

## 2013-02-25 MED ORDER — TECHNETIUM TC 99M MEDRONATE IV KIT
25.0000 | PACK | Freq: Once | INTRAVENOUS | Status: AC | PRN
  Administered 2013-02-25: 24 via INTRAVENOUS

## 2013-02-28 ENCOUNTER — Encounter (HOSPITAL_BASED_OUTPATIENT_CLINIC_OR_DEPARTMENT_OTHER): Payer: Medicare Other

## 2013-02-28 ENCOUNTER — Encounter (HOSPITAL_COMMUNITY): Payer: Self-pay

## 2013-02-28 VITALS — BP 141/82 | HR 96 | Temp 97.6°F | Resp 18 | Wt 188.5 lb

## 2013-02-28 DIAGNOSIS — C61 Malignant neoplasm of prostate: Secondary | ICD-10-CM

## 2013-02-28 DIAGNOSIS — C189 Malignant neoplasm of colon, unspecified: Secondary | ICD-10-CM

## 2013-02-28 NOTE — Progress Notes (Signed)
[facility] [phone]   [phone]  OFFICE PROGRESS NOTE  Alinda Deem, MD [address] 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 It was 0.25 in August 2010 and has gradually risen to 0.88.  Patient is known to have her do grade 1 neuropathy secondary to oxaliplatin.  INTERVAL HISTORY:   KEYLEN ECKENRODE 72 y.o. male returns to the clinic today for interval followup. He denies any new problem today.  He tells me he is eating well and his weight is stable.  He denies melena, hematochezia or hematemesis.  He also denies abdominal pain or shortness of breath.  I am not able to find a new recent colonoscopy report but I would investigate this further.  Patient gets regular CEA and images scans with PSA on 6 monthly visits.  MEDICAL HISTORY: Past Medical History  Diagnosis Date  . Varicose veins of lower limb 1985    rt. leg  . 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 Prostate cancer     prostate 2004/surg  . Colon cancer     colon 2010/surg and chemo    ALLERGIES:  is allergic to tetanus toxoids.  MEDICATIONS:  Current Outpatient Prescriptions  Medication Sig Dispense Refill  . acetaminophen (TYLENOL) 325 MG tablet Take 650 mg by mouth every 6 (six) hours as needed.      [name] aspirin 81 MG tablet Take 81 mg by mouth daily.        . simvastatin (ZOCOR) 20 MG tablet Take 20 mg by mouth Daily. Plans to start today       No current facility-administered medications for this visit.    SURGICAL HISTORY:  Past Surgical History  Procedure Laterality  Date  . Colon surgery    . Prostate surgery    . Styloid process excision    . Hand tendon surgery      lt. hand repair     REVIEW OF SYSTEMS: 14 point review of system is as in the history above otherwise negative. He has conjunctiva injection on both sides but states that he sees an eye doctor for this problem.   PHYSICAL EXAMINATION:  Blood pressure 141/82, pulse 96, temperature 97.6 F (36.4 C), temperature source Oral, resp. rate 18, weight 188 lb 8 oz (85.503 kg). GENERAL: No distress. SKIN:  No rashes or significant lesions  HEAD: Normocephalic, No masses, lesions, tenderness or abnormalities  EYES: Conjunctiva are pink  injected . ENT: External ears normal ,lips, buccal mucosa, and tongue normal and mucous membranes are moist  LYMPH: No palpable lymphadenopathy, in the neck or supraclavicular areas. LUNGS: clear to auscultation , no crackles or wheezes HEART: regular rate & rhythm, no murmurs, no gallops, S1 normal and S2 normal  ABDOMEN: Abdomen soft, non-tender, normal bowel sounds, no masses or organomegaly and no hepatosplenomegaly palpable EXTREMITIES: No edema, no skin discoloration or tenderness NEURO: alert & oriented      LABORATORY DATA: Lab Results  Component Value Date   WBC 11.8* 02/19/2013   HGB 15.5 02/19/2013   HCT 45.5 02/19/2013  MCV 89.7 02/19/2013   PLT 304 02/19/2013      Chemistry      Component Value Date/Time   NA 134* 02/19/2013 1330   K 4.2 02/19/2013 1330   CL 99 02/19/2013 1330   CO2 29 02/19/2013 1330   BUN 11 02/19/2013 1330   CREATININE 0.95 02/19/2013 1330      Component Value Date/Time   CALCIUM 9.6 02/19/2013 1330   ALKPHOS 113 02/19/2013 1330   AST 16 02/19/2013 1330   ALT 11 02/19/2013 1330   BILITOT 0.3 02/19/2013 1330       RADIOGRAPHIC STUDIES:  I Personally reviewed the images.  Ct Chest W Contrast  02/24/2013 .   CT CHEST, ABDOMEN AND PELVIS WITH CONTRAST   IMPRESSION:  1. No acute process or evidence of metastatic  disease in the abdomen or pelvis. 2.  Similar nonspecific hypoattenuation within the subcapsular spleen. 3.  Similar bilateral adrenal hyperplasia. 4.  Status post right hemicolectomy and prostatectomy.   Original Report Authenticated By: Jeronimo Greaves, M.D.   Nm Bone Scan Whole Body  02/25/2013   *RADIOLOGY REPORT*  Clinical Data: Prostate cancer, mildly rising PSA  NUCLEAR MEDICINE WHOLE BODY BONE SCINTIGRAPHY  .  IMPRESSION: No scintigraphic evidence of osseous metastatic disease. No interval change.   Original Report Authenticated By: Ulyses Southward, M.D.   Ct Abdomen Pelvis W Contrast  02/24/2013   *.  CT CHEST, ABDOMEN AND PELVIS WITH CONTRAST   IMPRESSION:  1. No acute process or evidence of metastatic disease in the abdomen or pelvis. 2.  Similar nonspecific hypoattenuation within the subcapsular spleen. 3.  Similar bilateral adrenal hyperplasia. 4.  Status post right hemicolectomy and prostatectomy.   Original Report Authenticated By: Jeronimo Greaves, M.D.     ASSESSMENT:  1.Adenocarcinoma of the colon: No evidence of disease at this time. Last colonoscopy  On record was 02/15/2010 and patient was found to have 3 polyps and  Repeat in 2 years was recommended.pathology showed adenomatous polyp in the transverse colon.  2.  Prostate adenocarcinoma: status post prostatectomy, has PSA relapse without evidence of metastasis.   PLAN:  1. Return to clinic in 6 months.  I explained to his that after 5 years which will be next year we'll stop yearly imaging for the colon cancer. 2. CBC CMP CEA at next visit. 3. PSA ordered at next visit. His prostate cancer can be followed with PSA and prn bone scan. 4. Urologic followup as scheduled. 5. We will instruct patient  to make an appointment for followup colonoscopy with Dr. Everardo Pacific if he hasn't done so.    All questions were satisfactorily answered. Patient knows to call if  any concern arises.  I spent more than 50 % counseling the patient face  to face. The total time spent in the appointment was 30 minutes.    Sherral Hammers, MD FACP. Hematology/Oncology.

## 2013-02-28 NOTE — Patient Instructions (Addendum)
.  [facility] Discharge Instructions  RECOMMENDATIONS MADE BY THE CONSULTANT AND ANY TEST RESULTS WILL BE SENT TO YOUR REFERRING PHYSICIAN.  EXAM FINDINGS BY THE PHYSICIAN TODAY AND SIGNS OR SYMPTOMS TO REPORT TO CLINIC OR PRIMARY PHYSICIAN:  Continue port flushes every 6 weeks  INSTRUCTIONS GIVEN AND DISCUSSED:   SPECIAL INSTRUCTIONS/FOLLOW-UP:  labs before visit 6 months to see [place]  Thank you for choosing [name] to provide your oncology and hematology care.  To afford each patient quality time with our providers, please arrive at least 15 minutes before your scheduled appointment time.  With your help, our goal is to use those 15 minutes to complete the necessary work-up to ensure our physicians have the information they need to help with your evaluation and healthcare recommendations.    Effective January 1st, 2014, we ask that you re-schedule your appointment with our physicians should you arrive 10 or more minutes late for your appointment.  We strive to give you quality time with our providers, and arriving late affects you and other patients whose appointments are after yours.    Again, thank you for choosing [facility].  Our hope is that these requests will decrease the amount of time that you wait before being seen by our physicians.       _____________________________________________________________  Should you have questions after your visit to [facility], please contact our office at [phone] between the hours of 8:30 a.m. and 5:00 p.m.  Voicemails left after 4:30 p.m. will not be returned until the following business day.  For prescription refill requests, have your pharmacy contact our office with your prescription refill request.

## 2013-03-03 ENCOUNTER — Telehealth (HOSPITAL_COMMUNITY): Payer: Self-pay

## 2013-03-03 NOTE — Telephone Encounter (Signed)
Message copied by Evelena Leyden on Mon Mar 03, 2013  1:50 PM ------      Message from: Sherral Hammers      Created: Fri Feb 28, 2013  4:36 PM      Regarding: Colonoscopy       Please instruct patient  to make an appointment for followup colonoscopy with Dr. Everardo Pacific if he hasn't done so.            Thanks,      Ike. ------

## 2013-03-03 NOTE — Telephone Encounter (Signed)
Spoke with wife and she will let Roberto [name] know that he needs to see Dr. Elder Cyphers for follow-up colonoscopy.

## 2013-04-02 ENCOUNTER — Encounter (HOSPITAL_COMMUNITY): Payer: Medicare Other | Attending: Oncology

## 2013-04-02 ENCOUNTER — Encounter (HOSPITAL_COMMUNITY): Payer: Self-pay

## 2013-04-02 DIAGNOSIS — Z452 Encounter for adjustment and management of vascular access device: Secondary | ICD-10-CM

## 2013-04-02 DIAGNOSIS — Z95828 Presence of other vascular implants and grafts: Secondary | ICD-10-CM

## 2013-04-02 DIAGNOSIS — Z9889 Other specified postprocedural states: Secondary | ICD-10-CM | POA: Insufficient documentation

## 2013-04-02 DIAGNOSIS — C61 Malignant neoplasm of prostate: Secondary | ICD-10-CM

## 2013-04-02 DIAGNOSIS — C182 Malignant neoplasm of ascending colon: Secondary | ICD-10-CM

## 2013-04-02 HISTORY — DX: Presence of other vascular implants and grafts: Z95.828

## 2013-04-02 MED ORDER — SODIUM CHLORIDE 0.9 % IJ SOLN
10.0000 mL | INTRAMUSCULAR | Status: DC | PRN
  Administered 2013-04-02: 10 mL via INTRAVENOUS
  Filled 2013-04-02: qty 10

## 2013-04-02 MED ORDER — HEPARIN SOD (PORK) LOCK FLUSH 100 UNIT/ML IV SOLN
INTRAVENOUS | Status: AC
  Filled 2013-04-02: qty 5

## 2013-04-02 MED ORDER — HEPARIN SOD (PORK) LOCK FLUSH 100 UNIT/ML IV SOLN
500.0000 [IU] | Freq: Once | INTRAVENOUS | Status: AC
  Administered 2013-04-02: 500 [IU] via INTRAVENOUS
  Filled 2013-04-02: qty 5

## 2013-04-02 NOTE — Progress Notes (Signed)
Roberto [name] presented for Portacath access and flush. Proper placement of portacath confirmed by CXR. Portacath located left chest wall accessed with  H 20 needle. Good blood return present. Portacath flushed with 20ml NS and 500U/5ml Heparin and needle removed intact. Procedure without incident. Patient tolerated procedure well.   

## 2013-05-14 ENCOUNTER — Encounter (HOSPITAL_COMMUNITY): Payer: Medicare Other | Attending: Oncology

## 2013-05-14 DIAGNOSIS — Z95828 Presence of other vascular implants and grafts: Secondary | ICD-10-CM

## 2013-05-14 DIAGNOSIS — C182 Malignant neoplasm of ascending colon: Secondary | ICD-10-CM

## 2013-05-14 DIAGNOSIS — Z9889 Other specified postprocedural states: Secondary | ICD-10-CM | POA: Insufficient documentation

## 2013-05-14 DIAGNOSIS — Z452 Encounter for adjustment and management of vascular access device: Secondary | ICD-10-CM

## 2013-05-14 DIAGNOSIS — C61 Malignant neoplasm of prostate: Secondary | ICD-10-CM

## 2013-05-14 MED ORDER — SODIUM CHLORIDE 0.9 % IJ SOLN
10.0000 mL | INTRAMUSCULAR | Status: DC | PRN
  Administered 2013-05-14: 10 mL via INTRAVENOUS

## 2013-05-14 MED ORDER — HEPARIN SOD (PORK) LOCK FLUSH 100 UNIT/ML IV SOLN
500.0000 [IU] | Freq: Once | INTRAVENOUS | Status: AC
  Administered 2013-05-14: 500 [IU] via INTRAVENOUS

## 2013-05-14 MED ORDER — HEPARIN SOD (PORK) LOCK FLUSH 100 UNIT/ML IV SOLN
INTRAVENOUS | Status: AC
  Filled 2013-05-14: qty 5

## 2013-05-14 NOTE — Progress Notes (Signed)
Roberto [name] Roberto [name] presented for [employer] and flush. Proper placement of portacath confirmed by CXR. Portacath located left chest wall accessed with  H 20 needle. Good blood return present. Portacath flushed with 20ml NS and 500U/66ml Heparin and needle removed intact. Procedure without incident. Patient tolerated procedure well.

## 2013-06-25 ENCOUNTER — Encounter (HOSPITAL_COMMUNITY): Payer: Medicare Other | Attending: Oncology

## 2013-06-25 DIAGNOSIS — C182 Malignant neoplasm of ascending colon: Secondary | ICD-10-CM

## 2013-06-25 DIAGNOSIS — Z452 Encounter for adjustment and management of vascular access device: Secondary | ICD-10-CM

## 2013-06-25 DIAGNOSIS — Z95828 Presence of other vascular implants and grafts: Secondary | ICD-10-CM

## 2013-06-25 DIAGNOSIS — Z9889 Other specified postprocedural states: Secondary | ICD-10-CM | POA: Insufficient documentation

## 2013-06-25 MED ORDER — HEPARIN SOD (PORK) LOCK FLUSH 100 UNIT/ML IV SOLN
500.0000 [IU] | Freq: Once | INTRAVENOUS | Status: AC
  Administered 2013-06-25: 500 [IU] via INTRAVENOUS

## 2013-06-25 MED ORDER — HEPARIN SOD (PORK) LOCK FLUSH 100 UNIT/ML IV SOLN
INTRAVENOUS | Status: AC
  Filled 2013-06-25: qty 5

## 2013-06-25 MED ORDER — SODIUM CHLORIDE 0.9 % IJ SOLN
10.0000 mL | INTRAMUSCULAR | Status: DC | PRN
  Administered 2013-06-25: 10 mL via INTRAVENOUS

## 2013-06-25 NOTE — Progress Notes (Signed)
Jerry V Scism presented for Portacath access and flush. Portacath located left chest wall accessed with  H 20 needle. Good blood return present. Portacath flushed with 20ml NS and 500U/5ml Heparin and needle removed intact. Procedure without incident. Patient tolerated procedure well.   

## 2013-08-05 ENCOUNTER — Encounter (HOSPITAL_COMMUNITY): Payer: Medicare Other | Attending: Oncology

## 2013-08-05 DIAGNOSIS — Z452 Encounter for adjustment and management of vascular access device: Secondary | ICD-10-CM

## 2013-08-05 DIAGNOSIS — C182 Malignant neoplasm of ascending colon: Secondary | ICD-10-CM

## 2013-08-05 DIAGNOSIS — C189 Malignant neoplasm of colon, unspecified: Secondary | ICD-10-CM | POA: Insufficient documentation

## 2013-08-05 MED ORDER — HEPARIN SOD (PORK) LOCK FLUSH 100 UNIT/ML IV SOLN
500.0000 [IU] | Freq: Once | INTRAVENOUS | Status: AC
  Administered 2013-08-05: 500 [IU] via INTRAVENOUS

## 2013-08-05 MED ORDER — SODIUM CHLORIDE 0.9 % IJ SOLN
10.0000 mL | INTRAMUSCULAR | Status: DC | PRN
  Administered 2013-08-05: 10 mL via INTRAVENOUS

## 2013-08-05 NOTE — Progress Notes (Signed)
Roberto [name] presented for Portacath access and flush. Portacath located left chest wall accessed with  H 20 needle. Good blood return present. Portacath flushed with 20ml NS and 500U/5ml Heparin and needle removed intact. Procedure without incident. Patient tolerated procedure well.   

## 2013-09-01 ENCOUNTER — Encounter (HOSPITAL_COMMUNITY): Payer: Medicare Other | Attending: Oncology

## 2013-09-01 DIAGNOSIS — Z9889 Other specified postprocedural states: Secondary | ICD-10-CM | POA: Insufficient documentation

## 2013-09-01 DIAGNOSIS — C61 Malignant neoplasm of prostate: Secondary | ICD-10-CM | POA: Insufficient documentation

## 2013-09-01 DIAGNOSIS — C182 Malignant neoplasm of ascending colon: Secondary | ICD-10-CM

## 2013-09-01 DIAGNOSIS — C189 Malignant neoplasm of colon, unspecified: Secondary | ICD-10-CM

## 2013-09-01 LAB — COMPREHENSIVE METABOLIC PANEL
ALK PHOS: 116 U/L (ref 39–117)
ALT: 10 U/L (ref 0–53)
AST: 15 U/L (ref 0–37)
Albumin: 3.9 g/dL (ref 3.5–5.2)
BUN: 10 mg/dL (ref 6–23)
CALCIUM: 10 mg/dL (ref 8.4–10.5)
CO2: 29 mEq/L (ref 19–32)
CREATININE: 0.94 mg/dL (ref 0.50–1.35)
Chloride: 99 mEq/L (ref 96–112)
GFR calc non Af Amer: 83 mL/min — ABNORMAL LOW (ref >90)
GLUCOSE: 107 mg/dL — AB (ref 70–99)
Potassium: 5.6 mEq/L — ABNORMAL HIGH (ref 3.7–5.3)
Sodium: 137 mEq/L (ref 137–147)
TOTAL PROTEIN: 8.2 g/dL (ref 6.0–8.3)
Total Bilirubin: 0.3 mg/dL (ref 0.3–1.2)

## 2013-09-01 LAB — CBC WITH DIFFERENTIAL/PLATELET
BASOS ABS: 0 10*3/uL (ref 0.0–0.1)
BASOS PCT: 0 % (ref 0–1)
EOS ABS: 0.2 10*3/uL (ref 0.0–0.7)
EOS PCT: 2 % (ref 0–5)
HEMATOCRIT: 49.1 % (ref 39.0–52.0)
Hemoglobin: 16.8 g/dL (ref 13.0–17.0)
Lymphocytes Relative: 38 % (ref 12–46)
Lymphs Abs: 3.7 10*3/uL (ref 0.7–4.0)
MCH: 30.4 pg (ref 26.0–34.0)
MCHC: 34.2 g/dL (ref 30.0–36.0)
MCV: 88.8 fL (ref 78.0–100.0)
MONO ABS: 1.2 10*3/uL — AB (ref 0.1–1.0)
Monocytes Relative: 12 % (ref 3–12)
Neutro Abs: 4.7 10*3/uL (ref 1.7–7.7)
Neutrophils Relative %: 48 % (ref 43–77)
Platelets: 291 10*3/uL (ref 150–400)
RBC: 5.53 MIL/uL (ref 4.22–5.81)
RDW: 13.8 % (ref 11.5–15.5)
WBC: 9.8 10*3/uL (ref 4.0–10.5)

## 2013-09-01 LAB — PSA: PSA: 1.08 ng/mL (ref <=4.00)

## 2013-09-01 LAB — CEA: CEA: 4.2 ng/mL (ref 0.0–5.0)

## 2013-09-01 NOTE — Progress Notes (Signed)
Labs drawn today for cbc/diff,cea,cmp,psa

## 2013-09-03 ENCOUNTER — Encounter (HOSPITAL_BASED_OUTPATIENT_CLINIC_OR_DEPARTMENT_OTHER): Payer: Medicare Other

## 2013-09-03 ENCOUNTER — Encounter (HOSPITAL_COMMUNITY): Payer: Self-pay

## 2013-09-03 VITALS — BP 119/79 | HR 83 | Temp 97.5°F | Resp 20 | Wt 191.0 lb

## 2013-09-03 DIAGNOSIS — J449 Chronic obstructive pulmonary disease, unspecified: Secondary | ICD-10-CM

## 2013-09-03 DIAGNOSIS — Z85038 Personal history of other malignant neoplasm of large intestine: Secondary | ICD-10-CM

## 2013-09-03 DIAGNOSIS — Z95828 Presence of other vascular implants and grafts: Secondary | ICD-10-CM

## 2013-09-03 DIAGNOSIS — C61 Malignant neoplasm of prostate: Secondary | ICD-10-CM

## 2013-09-03 DIAGNOSIS — C189 Malignant neoplasm of colon, unspecified: Secondary | ICD-10-CM

## 2013-09-03 DIAGNOSIS — F17200 Nicotine dependence, unspecified, uncomplicated: Secondary | ICD-10-CM

## 2013-09-03 DIAGNOSIS — D751 Secondary polycythemia: Secondary | ICD-10-CM

## 2013-09-03 MED ORDER — LIDOCAINE-PRILOCAINE 2.5-2.5 % EX CREA: TOPICAL_CREAM | CUTANEOUS | Status: DC

## 2013-09-03 NOTE — Progress Notes (Signed)
[facility]  OFFICE PROGRESS NOTE  [name], MD [address] [place] [address]  DIAGNOSIS: Port-a-cath in place - Plan: lovastatin (MEVACOR) 20 MG tablet, lidocaine-prilocaine (EMLA) cream  Colon cancer - Plan: lovastatin (MEVACOR) 20 MG tablet, lidocaine-prilocaine (EMLA) cream  Chief Complaint  Patient presents with  . Prostate Cancer    Radical prostatectomy 2008    CURRENT THERAPY: Watchful surveillance for colon cancer and prostate cancer.  INTERVAL HISTORY: Roberto [name] 73 y.o. male returns for followup of colon cancer and prostate cancer.  He continues to do well with no nausea, vomiting, but with chronic cough without expectoration. He continues to smoke one pack of cigarettes daily. He denies dysuria, hematuria, incontinence, back pain, bone pain, cough, wheezing, fever, night sweats, breast pain, lower extremity swelling or redness, chest pain, PND, orthopnea, palpitations, skin rash, headache, or seizures. Last colonoscopy was done in August 2014 request repeat in 2 years. CT scan done in 03/18/13 was normal and bone scan was also normal. His most recent PSA has risen to 1.08 with previous died of 0.85 in 03/18/13. Value in January 2014 was 0.84.  MEDICAL HISTORY: Past Medical History  Diagnosis Date  . Varicose veins of lower limb 1985    rt. leg  . 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 Prostate cancer     prostate 2004/surg  . Colon cancer     colon 2010/surg and chemo  . Port-a-cath in place 04/02/2013    INTERIM HISTORY: has Colon cancer and Port-a-cath in place on his problem list.   1. 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 Prostate cancer status post radical prostatectomy in October 2004 with a minimally rising PSA. It was 0.25 in August 2010 and has  gradually risen to 0.88.   [name] ALLERGIES:  is allergic to tetanus toxoids.  MEDICATIONS: has a current medication list which includes the following prescription(s): acetaminophen, aspirin, lovastatin, and lidocaine-prilocaine.  SURGICAL HISTORY:  Past Surgical History  Procedure Laterality Date  . Colon surgery    . Prostate surgery    . Styloid process excision    . Hand tendon surgery      lt. hand repair    FAMILY HISTORY: family history includes Cancer in his father.  SOCIAL HISTORY:  reports that he has been smoking.  He has never used smokeless tobacco. He reports that he does not drink alcohol or use illicit drugs.  REVIEW OF SYSTEMS:  Other than that discussed above is noncontributory.  PHYSICAL EXAMINATION: ECOG PERFORMANCE STATUS: 0 - Asymptomatic  Blood pressure 119/79, pulse 83, temperature 97.5 F (36.4 C), temperature source Oral, resp. rate 20, weight 191 lb (86.637 kg).  GENERAL:alert, no distress and comfortable. For complexion SKIN: skin color, texture, turgor are normal, no rashes or significant lesions EYES: PERLA; Conjunctiva injected with no evidence of icterus.,  OROPHARYNX:no exudate, no erythema on lips, buccal mucosa, or tongue. NECK: supple, thyroid normal size, non-tender, without nodularity. No masses CHEST: Increased AP diameter with no gynecomastia. LYMPH:  no palpable lymphadenopathy in the cervical, axillary or inguinal LUNGS: clear to auscultation and percussion with normal breathing effort HEART: regular rate & rhythm and no murmurs. ABDOMEN:abdomen soft, non-tender and normal bowel sounds. No hepatosplenomegaly. MUSCULOSKELETAL:no cyanosis of digits and no  clubbing. Range of motion normal.  NEURO: alert & oriented x 3 with fluent speech, no focal motor/sensory deficits   LABORATORY DATA: Infusion on 09/01/2013  Component Date Value Range Status  . WBC 09/01/2013 9.8  4.0 - 10.5 K/uL Final  . RBC 09/01/2013 5.53  4.22 - 5.81 MIL/uL Final   . Hemoglobin 09/01/2013 16.8  13.0 - 17.0 g/dL Final  . HCT 09/01/2013 49.1  39.0 - 52.0 % Final  . MCV 09/01/2013 88.8  78.0 - 100.0 fL Final  . MCH 09/01/2013 30.4  26.0 - 34.0 pg Final  . MCHC 09/01/2013 34.2  30.0 - 36.0 g/dL Final  . RDW 09/01/2013 13.8  11.5 - 15.5 % Final  . Platelets 09/01/2013 291  150 - 400 K/uL Final  . Neutrophils Relative % 09/01/2013 48  43 - 77 % Final  . Neutro Abs 09/01/2013 4.7  1.7 - 7.7 K/uL Final  . Lymphocytes Relative 09/01/2013 38  12 - 46 % Final  . Lymphs Abs 09/01/2013 3.7  0.7 - 4.0 K/uL Final  . Monocytes Relative 09/01/2013 12  3 - 12 % Final  . Monocytes Absolute 09/01/2013 1.2* 0.1 - 1.0 K/uL Final  . Eosinophils Relative 09/01/2013 2  0 - 5 % Final  . Eosinophils Absolute 09/01/2013 0.2  0.0 - 0.7 K/uL Final  . Basophils Relative 09/01/2013 0  0 - 1 % Final  . Basophils Absolute 09/01/2013 0.0  0.0 - 0.1 K/uL Final  . WBC Morphology 09/01/2013 ATYPICAL LYMPHOCYTES   Final  . CEA 09/01/2013 4.2  0.0 - 5.0 ng/mL Final   Performed at [facility]  . Sodium 09/01/2013 137  137 - 147 mEq/L Final  . Potassium 09/01/2013 5.6* 3.7 - 5.3 mEq/L Final  . Chloride 09/01/2013 99  96 - 112 mEq/L Final  . CO2 09/01/2013 29  19 - 32 mEq/L Final  . Glucose, Bld 09/01/2013 107* 70 - 99 mg/dL Final  . BUN 09/01/2013 10  6 - 23 mg/dL Final  . Creatinine, Ser 09/01/2013 0.94  0.50 - 1.35 mg/dL Final  . Calcium 09/01/2013 10.0  8.4 - 10.5 mg/dL Final  . Total Protein 09/01/2013 8.2  6.0 - 8.3 g/dL Final  . Albumin 09/01/2013 3.9  3.5 - 5.2 g/dL Final  . AST 09/01/2013 15  0 - 37 U/L Final  . ALT 09/01/2013 10  0 - 53 U/L Final  . Alkaline Phosphatase 09/01/2013 116  39 - 117 U/L Final  . Total Bilirubin 09/01/2013 0.3  0.3 - 1.2 mg/dL Final  . GFR calc non Af Amer 09/01/2013 83* >90 mL/min Final  . GFR calc Af Amer 09/01/2013 >90  >90 mL/min Final   Comment: (NOTE)                          The eGFR has been calculated using the CKD EPI  equation.                          This calculation has not been validated in all clinical situations.                          eGFR's persistently <90 mL/min signify possible Chronic Kidney                          Disease.  [name] PSA 09/01/2013 1.08  <=  4.00 ng/mL Final   Comment: (NOTE)                          Test Methodology: ECLIA PSA (Electrochemiluminescence Immunoassay)                          For PSA values from 2.5-4.0, particularly in younger men <60 years                          old, the AUA and NCCN suggest testing for % Free PSA (3515) and                          evaluation of the rate of increase in PSA (PSA velocity).                          Performed at [facility]: Original prostate specimen showed Gleason score of 4+3 equals 7.  Urinalysis No results found for this basename: colorurine, appearanceur, labspec, phurine, glucoseu, hgbur, bilirubinur, ketonesur, proteinur, urobilinogen, nitrite, leukocytesur    RADIOGRAPHIC STUDIES:   NM Bone Scan Whole Body Status: Final result         PACS Images    Show images for NM Bone Scan Whole Body         Study Result    *RADIOLOGY REPORT*  Clinical Data: Prostate cancer, mildly rising PSA  NUCLEAR MEDICINE WHOLE BODY BONE SCINTIGRAPHY  Technique: Whole body anterior and posterior images were obtained  approximately 3 hours after intravenous injection of  radiopharmaceutical.  Radiopharmaceutical: 24MILLI CURIE TC-MDP TECHNETIUM TC 35M  MEDRONATE IV KIT  Comparison: 02/13/2011  Findings:  Small focus of tracer extravasation at the injection site at the  left antecubital fossa.  Minimal uptake at the knees, hips, and shoulders, greatest at right  [facility] joint, typically degenerative.  Minimal chronic increased tracer localization at the posterior  calcanei, stable, typically related to trauma, tendonitis or  retrocalcaneal bursitis.  No abnormal sites of osseous tracer accumulation are  identified  which are suspicious for metastatic prostate cancer.  Expected urinary tract and soft tissue distribution of tracer.  IMPRESSION:  No scintigraphic evidence of osseous metastatic disease.  No interval change.  Original Report Authenticated       CT Abdomen Pelvis W Contrast Status: Final result         PACS Images    Show images for CT Abdomen Pelvis W Contrast         Study Result    *RADIOLOGY REPORT*  Clinical Data: Colon cancer in 2010 with surgery and chemotherapy.  History of prostate cancer 10 years ago.  CT CHEST, ABDOMEN AND PELVIS WITH CONTRAST  Technique: Contiguous axial images of the chest abdomen and pelvis  were obtained after IV contrast administration.  Contrast: 100 ml Omnipaque-300  Comparison: 02/22/2012  CT CHEST  Findings: Lung windows demonstrate mild centrilobular emphysema.  Probable secretions in the dependent right sided trachea on image  13/series 3.  Similar scarring at the right lung base.  Atelectasis or developing scar along the left major fissure  superiorly on image 24/series 3.  The previous described right apical lung nodule is unchanged at 3  mm image 21/series 3; likely a subpleural lymph node.  Soft tissue windows demonstrate no supraclavicular adenopathy.  Normal heart  size, without pericardial effusion. Multivessel  coronary artery atherosclerosis. Bilateral pleural plaques and  calcification. No pleural fluid. No central pulmonary embolism, on  this non-dedicated study. Small middle mediastinal nodes which are  similar. No hilar adenopathy. A left-sided Port-A-Cath terminates  at the cavoatrial junction.  IMPRESSION:  1. No acute process or evidence of metastatic disease in the chest.  2. Findings of asbestos related pleural disease, as before.  3. Similar right apical lung nodule, likely a subpleural lymph  node.  4. Multivessel coronary artery atherosclerosis.  CT ABDOMEN AND PELVIS  Findings: Right  hepatic lobe subcapsular calcification is similar  9 mm on image 66/series 2. Vague hypoattenuation along the splenic  capsule is unchanged on image 66/series 2.  Normal stomach, pancreas, gallbladder, biliary tract.  Redemonstration of bilateral adrenal thickening. Normal kidneys.  Aortic and branch vessel atherosclerosis. No retroperitoneal or  retrocrural adenopathy.  Scattered colonic diverticula. Right hemicolectomy. Normal small  bowel without abdominal ascites.  No evidence of omental or peritoneal disease.  Right fat containing inguinal hernia. Status post pelvic node  dissection. No pelvic adenopathy. Prostatectomy. Normal urinary  bladder. No significant free fluid. Tiny left hydrocele.  Degenerative partial fusion of the bilateral sacroiliac joints.  IMPRESSION:  1. No acute process or evidence of metastatic disease in the  abdomen or pelvis.  2. Similar nonspecific hypoattenuation within the subcapsular  spleen.  3. Similar bilateral adrenal hyperplasia.  4. Status post right hemicolectomy and prostatectomy.  Original Report Authenticated     ASSESSMENT:  1.  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  Prostate cancer status post radical prostatectomy in October 2004 with a minimally rising PSA. It was 0.25 in August 2010 and has gradually risen to 0.88. 3. 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 4. Port-A-Cath in place and working well.   PLAN:  #1. Based on the PSA velocity and his original Gleason score, recommend continued surveillance with repeat examination and lab work in 3 months. If PSA doubles within the 3 month period, staging will be repeated and possible androgen ablation effected. #2. Repeat colonoscopy in August 2016. #3. Patient was encouraged to stop smoking by decreasing by one cigarette each day  until he is down to 3 or 4 per day. Wife also smokes and it was suggested that they both make New Year's resolutions to obliterate this habit. #4. Followup in 3 months with lab tests and examination. Port-A-Cath will be flushed in 6 weeks.   All questions were answered. The patient knows to call the clinic with any problems, questions or concerns. We can certainly see the patient much sooner if necessary.   I spent 25 minutes counseling the patient face to face. The total time spent in the appointment was 30 minutes.    Doroteo Bradford, MD 09/03/2013 10:56 AM

## 2013-09-03 NOTE — Patient Instructions (Signed)
[facility] Discharge Instructions  RECOMMENDATIONS MADE BY THE CONSULTANT AND ANY TEST RESULTS WILL BE SENT TO YOUR REFERRING PHYSICIAN.  EXAM FINDINGS BY THE PHYSICIAN TODAY AND SIGNS OR SYMPTOMS TO REPORT TO CLINIC OR PRIMARY PHYSICIAN:   Labs in 3 months with port flush: CBC diff, CMET, PSA  May 5 @ 1:00  Return to see Dr. Barnet Glasgow in 3 months  May 6 @ 11  Thank you for choosing [facility] to provide your oncology and hematology care.  To afford each patient quality time with our providers, please arrive at least 15 minutes before your scheduled appointment time.  With your help, our goal is to use those 15 minutes to complete the necessary work-up to ensure our physicians have the information they need to help with your evaluation and healthcare recommendations.    Effective January 1st, 2014, we ask that you re-schedule your appointment with our physicians should you arrive 10 or more minutes late for your appointment.  We strive to give you quality time with our providers, and arriving late affects you and other patients whose appointments are after yours.    Again, thank you for choosing [facility].  Our hope is that these requests will decrease the amount of time that you wait before being seen by our physicians.       _____________________________________________________________  Should you have questions after your visit to [facility], please contact our office at [phone] between the hours of 8:30 a.m. and 5:00 p.m.  Voicemails left after 4:30 p.m. will not be returned until the following business day.  For prescription refill requests, have your pharmacy contact our office with your prescription refill request.

## 2013-09-16 ENCOUNTER — Encounter (HOSPITAL_COMMUNITY): Payer: Medicare Other | Attending: Oncology

## 2013-09-16 DIAGNOSIS — Z95828 Presence of other vascular implants and grafts: Secondary | ICD-10-CM

## 2013-09-16 DIAGNOSIS — Z452 Encounter for adjustment and management of vascular access device: Secondary | ICD-10-CM

## 2013-09-16 DIAGNOSIS — Z9889 Other specified postprocedural states: Secondary | ICD-10-CM | POA: Insufficient documentation

## 2013-09-16 DIAGNOSIS — C182 Malignant neoplasm of ascending colon: Secondary | ICD-10-CM

## 2013-09-16 MED ORDER — HEPARIN SOD (PORK) LOCK FLUSH 100 UNIT/ML IV SOLN
500.0000 [IU] | Freq: Once | INTRAVENOUS | Status: AC
  Administered 2013-09-16: 500 [IU] via INTRAVENOUS

## 2013-09-16 MED ORDER — SODIUM CHLORIDE 0.9 % IJ SOLN
10.0000 mL | INTRAMUSCULAR | Status: DC | PRN
  Administered 2013-09-16: 10 mL via INTRAVENOUS

## 2013-09-16 MED ORDER — HEPARIN SOD (PORK) LOCK FLUSH 100 UNIT/ML IV SOLN
INTRAVENOUS | Status: AC
  Filled 2013-09-16: qty 5

## 2013-09-16 NOTE — Progress Notes (Signed)
Roberto [name] presented for Portacath access and flush. Proper placement of portacath confirmed by CXR. Portacath located left chest wall accessed with  H 20 needle. Good blood return present. Portacath flushed with 20ml NS and 500U/5ml Heparin and needle removed intact. Procedure without incident. Patient tolerated procedure well.   

## 2013-10-09 ENCOUNTER — Ambulatory Visit (HOSPITAL_COMMUNITY): Payer: Medicare Other

## 2013-10-28 ENCOUNTER — Encounter (HOSPITAL_COMMUNITY): Payer: Medicare Other

## 2013-11-04 ENCOUNTER — Encounter (HOSPITAL_COMMUNITY): Payer: Medicare Other | Attending: Oncology

## 2013-11-04 DIAGNOSIS — Z452 Encounter for adjustment and management of vascular access device: Secondary | ICD-10-CM

## 2013-11-04 DIAGNOSIS — C182 Malignant neoplasm of ascending colon: Secondary | ICD-10-CM

## 2013-11-04 DIAGNOSIS — C189 Malignant neoplasm of colon, unspecified: Secondary | ICD-10-CM | POA: Insufficient documentation

## 2013-11-04 MED ORDER — HEPARIN SOD (PORK) LOCK FLUSH 100 UNIT/ML IV SOLN
INTRAVENOUS | Status: AC
  Filled 2013-11-04: qty 5

## 2013-11-04 MED ORDER — SODIUM CHLORIDE 0.9 % IJ SOLN
10.0000 mL | Freq: Once | INTRAMUSCULAR | Status: AC
  Administered 2013-11-04: 10 mL via INTRAVENOUS

## 2013-11-04 MED ORDER — HEPARIN SOD (PORK) LOCK FLUSH 100 UNIT/ML IV SOLN
500.0000 [IU] | Freq: Once | INTRAVENOUS | Status: AC
  Administered 2013-11-04: 500 [IU] via INTRAVENOUS

## 2013-11-04 NOTE — Progress Notes (Signed)
Roberto [name] presented for Portacath access and flush. Proper placement of portacath confirmed by CXR. Portacath located LT chest wall accessed with  H 20 needle. Good blood return present. Portacath flushed with 38ml NS and 500U/42ml Heparin and needle removed intact. Procedure without incident. Patient tolerated procedure well.

## 2013-12-09 ENCOUNTER — Encounter (HOSPITAL_COMMUNITY): Payer: Medicare Other | Attending: Hematology and Oncology

## 2013-12-09 DIAGNOSIS — Z8546 Personal history of malignant neoplasm of prostate: Secondary | ICD-10-CM | POA: Insufficient documentation

## 2013-12-09 DIAGNOSIS — C61 Malignant neoplasm of prostate: Secondary | ICD-10-CM

## 2013-12-09 DIAGNOSIS — F17200 Nicotine dependence, unspecified, uncomplicated: Secondary | ICD-10-CM | POA: Insufficient documentation

## 2013-12-09 DIAGNOSIS — Z85038 Personal history of other malignant neoplasm of large intestine: Secondary | ICD-10-CM | POA: Insufficient documentation

## 2013-12-09 DIAGNOSIS — C189 Malignant neoplasm of colon, unspecified: Secondary | ICD-10-CM

## 2013-12-09 DIAGNOSIS — Z95828 Presence of other vascular implants and grafts: Secondary | ICD-10-CM

## 2013-12-09 DIAGNOSIS — Z9221 Personal history of antineoplastic chemotherapy: Secondary | ICD-10-CM | POA: Insufficient documentation

## 2013-12-09 DIAGNOSIS — J4489 Other specified chronic obstructive pulmonary disease: Secondary | ICD-10-CM | POA: Insufficient documentation

## 2013-12-09 DIAGNOSIS — C182 Malignant neoplasm of ascending colon: Secondary | ICD-10-CM

## 2013-12-09 DIAGNOSIS — Z09 Encounter for follow-up examination after completed treatment for conditions other than malignant neoplasm: Secondary | ICD-10-CM | POA: Insufficient documentation

## 2013-12-09 DIAGNOSIS — J449 Chronic obstructive pulmonary disease, unspecified: Secondary | ICD-10-CM | POA: Insufficient documentation

## 2013-12-09 LAB — CBC WITH DIFFERENTIAL/PLATELET
BASOS ABS: 0 10*3/uL (ref 0.0–0.1)
Basophils Relative: 0 % (ref 0–1)
Eosinophils Absolute: 0.1 10*3/uL (ref 0.0–0.7)
Eosinophils Relative: 1 % (ref 0–5)
HCT: 44.3 % (ref 39.0–52.0)
Hemoglobin: 14.6 g/dL (ref 13.0–17.0)
LYMPHS ABS: 3.5 10*3/uL (ref 0.7–4.0)
Lymphocytes Relative: 33 % (ref 12–46)
MCH: 29.6 pg (ref 26.0–34.0)
MCHC: 33 g/dL (ref 30.0–36.0)
MCV: 89.7 fL (ref 78.0–100.0)
MONOS PCT: 11 % (ref 3–12)
Monocytes Absolute: 1.2 10*3/uL — ABNORMAL HIGH (ref 0.1–1.0)
Neutro Abs: 5.9 10*3/uL (ref 1.7–7.7)
Neutrophils Relative %: 55 % (ref 43–77)
PLATELETS: 303 10*3/uL (ref 150–400)
RBC: 4.94 MIL/uL (ref 4.22–5.81)
RDW: 14.5 % (ref 11.5–15.5)
WBC: 10.7 10*3/uL — AB (ref 4.0–10.5)

## 2013-12-09 LAB — COMPREHENSIVE METABOLIC PANEL
ALBUMIN: 3.6 g/dL (ref 3.5–5.2)
ALT: 12 U/L (ref 0–53)
AST: 19 U/L (ref 0–37)
Alkaline Phosphatase: 103 U/L (ref 39–117)
BILIRUBIN TOTAL: 0.2 mg/dL — AB (ref 0.3–1.2)
BUN: 16 mg/dL (ref 6–23)
CALCIUM: 9.4 mg/dL (ref 8.4–10.5)
CHLORIDE: 102 meq/L (ref 96–112)
CO2: 26 mEq/L (ref 19–32)
CREATININE: 0.91 mg/dL (ref 0.50–1.35)
GFR calc Af Amer: >90 mL/min (ref >90)
GFR calc non Af Amer: 84 mL/min — ABNORMAL LOW (ref >90)
Glucose, Bld: 79 mg/dL (ref 70–99)
Potassium: 4.1 mEq/L (ref 3.7–5.3)
Sodium: 139 mEq/L (ref 137–147)
TOTAL PROTEIN: 7.3 g/dL (ref 6.0–8.3)

## 2013-12-09 MED ORDER — SODIUM CHLORIDE 0.9 % IJ SOLN
10.0000 mL | INTRAMUSCULAR | Status: DC | PRN
  Administered 2013-12-09: 10 mL via INTRAVENOUS

## 2013-12-09 MED ORDER — HEPARIN SOD (PORK) LOCK FLUSH 100 UNIT/ML IV SOLN
500.0000 [IU] | Freq: Once | INTRAVENOUS | Status: AC
  Administered 2013-12-09: 500 [IU] via INTRAVENOUS

## 2013-12-09 MED ORDER — HEPARIN SOD (PORK) LOCK FLUSH 100 UNIT/ML IV SOLN
INTRAVENOUS | Status: AC
  Filled 2013-12-09: qty 5

## 2013-12-09 NOTE — Progress Notes (Signed)
Roberto [name] presented for Portacath access and flush. Proper placement of portacath confirmed by CXR. Portacath located left chest wall accessed with  H 20 needle. Good blood return present. Portacath flushed with 20ml NS and 500U/5ml Heparin and needle removed intact. Procedure without incident. Patient tolerated procedure well.   

## 2013-12-10 ENCOUNTER — Encounter (HOSPITAL_COMMUNITY): Payer: Self-pay

## 2013-12-10 ENCOUNTER — Encounter (HOSPITAL_BASED_OUTPATIENT_CLINIC_OR_DEPARTMENT_OTHER): Payer: Medicare Other

## 2013-12-10 VITALS — BP 157/76 | HR 80 | Temp 98.1°F | Resp 18 | Wt 190.7 lb

## 2013-12-10 DIAGNOSIS — D751 Secondary polycythemia: Secondary | ICD-10-CM

## 2013-12-10 DIAGNOSIS — C189 Malignant neoplasm of colon, unspecified: Secondary | ICD-10-CM

## 2013-12-10 DIAGNOSIS — F17200 Nicotine dependence, unspecified, uncomplicated: Secondary | ICD-10-CM

## 2013-12-10 DIAGNOSIS — C61 Malignant neoplasm of prostate: Secondary | ICD-10-CM

## 2013-12-10 DIAGNOSIS — J449 Chronic obstructive pulmonary disease, unspecified: Secondary | ICD-10-CM

## 2013-12-10 DIAGNOSIS — C182 Malignant neoplasm of ascending colon: Secondary | ICD-10-CM

## 2013-12-10 LAB — PSA: PSA: 1.02 ng/mL (ref <=4.00)

## 2013-12-10 LAB — TESTOSTERONE: TESTOSTERONE: 312 ng/dL (ref 300–890)

## 2013-12-10 NOTE — Patient Instructions (Addendum)
[facility] Discharge Instructions  RECOMMENDATIONS MADE BY THE CONSULTANT AND ANY TEST RESULTS WILL BE SENT TO YOUR REFERRING PHYSICIAN.  Your next CT scan will be July 2nd.   We will see you after your scans in July.  Thank you for choosing [facility] to provide your oncology and hematology care.  To afford each patient quality time with our providers, please arrive at least 15 minutes before your scheduled appointment time.  With your help, our goal is to use those 15 minutes to complete the necessary work-up to ensure our physicians have the information they need to help with your evaluation and healthcare recommendations.    Effective January 1st, 2014, we ask that you re-schedule your appointment with our physicians should you arrive 10 or more minutes late for your appointment.  We strive to give you quality time with our providers, and arriving late affects you and other patients whose appointments are after yours.    Again, thank you for choosing [facility].  Our hope is that these requests will decrease the amount of time that you wait before being seen by our physicians.       _____________________________________________________________  Should you have questions after your visit to [facility], please contact our office at [phone] between the hours of 8:30 a.m. and 5:00 p.m.  Voicemails left after 4:30 p.m. will not be returned until the following business day.  For prescription refill requests, have your pharmacy contact our office with your prescription refill request.

## 2013-12-10 NOTE — Progress Notes (Signed)
[facility]  OFFICE PROGRESS NOTE  [name], MD [place] [id number]  DIAGNOSIS: Prostate ca - Plan: PSA, Testosterone, CBC with Differential, Comprehensive metabolic panel, CEA, PSA, CANCELED: AFP tumor marker  Colon cancer - Plan: CT Abdomen Pelvis W Contrast, CT Chest W Contrast, CBC with Differential, Comprehensive metabolic panel, CEA, PSA  Chief Complaint  Patient presents with  . Prostate Cancer  . Colon Cancer    CURRENT THERAPY: Watchful expectation for colon cancer and prostate cancer.  INTERVAL HISTORY: Roberto [name] 73 y.o. male returns for followup while undergoing surveillance for colon cancer and prostate cancer currently monitoring PSA velocity.  He continues to do well with good appetite. Bowel movements are regular with no melena, hematochezia, or incontinence. Denies urinary hesitancy, incontinence, lower extremity swelling or redness, but continues to smoke one pack of cigarettes daily associated with chronic cough but no expectoration or hemoptysis. He denies any epistaxis, hematuria, melena, or hematochezia. He also denies any abdominal distention, skin rash, worsening joint pain, headache, or seizures.  MEDICAL HISTORY: Past Medical History  Diagnosis Date  . Varicose veins of lower limb 1985    rt. leg  . 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 Prostate cancer     prostate 2004/surg  . Colon cancer     colon 2010/surg and chemo  . Port-a-cath in place 04/02/2013    INTERIM HISTORY: has Colon cancer and Port-a-cath in place on his problem list.    ALLERGIES:  is allergic to tetanus toxoids.  MEDICATIONS: has a current medication list which includes the following prescription(s): acetaminophen, aspirin, diclofenac, lidocaine-prilocaine, and lovastatin.  SURGICAL HISTORY:  Past Surgical History  Procedure Laterality Date  . Colon surgery    . Prostate surgery    . Styloid  process excision    . Hand tendon surgery      lt. hand repair    FAMILY HISTORY: family history includes Cancer in his father.  SOCIAL HISTORY:  reports that he has been smoking.  He has never used smokeless tobacco. He reports that he does not drink alcohol or use illicit drugs.  REVIEW OF SYSTEMS:  Other than that discussed above is noncontributory.  PHYSICAL EXAMINATION: ECOG PERFORMANCE STATUS: 1 - Symptomatic but completely ambulatory  Blood pressure 157/76, pulse 80, temperature 98.1 F (36.7 C), temperature source Oral, resp. rate 18, weight 190 lb 11.2 oz (86.501 kg), SpO2 97.00%.  GENERAL:alert, no distress and comfortable SKIN: skin color, texture, turgor are normal, no rashes or significant lesions EYES: PERLA; Conjunctiva are pink and non-injected, sclera clear SINUSES: No redness or tenderness over maxillary or ethmoid sinuses OROPHARYNX:no exudate, no erythema on lips, buccal mucosa, or tongue. NECK: supple, thyroid normal size, non-tender, without nodularity. No masses CHEST: Increased AP diameter with no breast masses. LifePort in place. LYMPH:  no palpable lymphadenopathy in the cervical, axillary or inguinal LUNGS: clear to auscultation and percussion with normal breathing effort. Scattered expiratory crackles. HEART: regular rate & rhythm and no murmurs. ABDOMEN:abdomen soft, non-tender and normal bowel sounds. No asthma megaly, ascites, or CVA tenderness. MUSCULOSKELETAL:no cyanosis of digits and no clubbing. Range of motion normal.  NEURO: alert & oriented x 3 with fluent speech, no focal motor/sensory deficits   LABORATORY DATA:Results for Roberto [name], Roberto [name] (MRN [id number]) as of 12/10/2013 11:06  Ref. Range 02/19/2013 13:30 09/01/2013 10:04 12/09/2013 12:39  PSA Latest Range: <=4.00 ng/mL 0.94 1.08 1.02  Infusion on 12/09/2013  Component Date Value Ref Range Status  . PSA 12/09/2013 1.02  <=4.00 ng/mL Final   Comment: (NOTE)                           Test Methodology: ECLIA PSA (Electrochemiluminescence Immunoassay)                          For PSA values from 2.5-4.0, particularly in younger men <60 years                          old, the AUA and NCCN suggest testing for % Free PSA (3515) and                          evaluation of the rate of increase in PSA (PSA velocity).                          Performed at [facility]  . Testosterone 12/09/2013 312  300 - 890 ng/dL Final   Comment: (NOTE)                                   Tanner Stage       Male              Male                                       I              < 30 ng/dL        < 10 ng/dL                                       II             < 150 ng/dL       < 30 ng/dL                                       III            100-320 ng/dL     < 35 ng/dL                                       IV             200-970 ng/dL     15-40 ng/dL                                       V/Adult        300-890 ng/dL     10-70 ng/dL                          Performed at [facility]  . WBC 12/09/2013  10.7* 4.0 - 10.5 K/uL Final  . RBC 12/09/2013 4.94  4.22 - 5.81 MIL/uL Final  . Hemoglobin 12/09/2013 14.6  13.0 - 17.0 g/dL Final  . HCT 12/09/2013 44.3  39.0 - 52.0 % Final  . MCV 12/09/2013 89.7  78.0 - 100.0 fL Final  . MCH 12/09/2013 29.6  26.0 - 34.0 pg Final  . MCHC 12/09/2013 33.0  30.0 - 36.0 g/dL Final  . RDW 12/09/2013 14.5  11.5 - 15.5 % Final  . Platelets 12/09/2013 303  150 - 400 K/uL Final  . Neutrophils Relative % 12/09/2013 55  43 - 77 % Final  . Lymphocytes Relative 12/09/2013 33  12 - 46 % Final  . Monocytes Relative 12/09/2013 11  3 - 12 % Final  . Eosinophils Relative 12/09/2013 1  0 - 5 % Final  . Basophils Relative 12/09/2013 0  0 - 1 % Final  . Neutro Abs 12/09/2013 5.9  1.7 - 7.7 K/uL Final  . Lymphs Abs 12/09/2013 3.5  0.7 - 4.0 K/uL Final  . Monocytes Absolute 12/09/2013 1.2* 0.1 - 1.0 K/uL Final  . Eosinophils Absolute 12/09/2013 0.1  0.0 - 0.7 K/uL  Final  . Basophils Absolute 12/09/2013 0.0  0.0 - 0.1 K/uL Final  . WBC Morphology 12/09/2013 ATYPICAL LYMPHOCYTES   Final  . Sodium 12/09/2013 139  137 - 147 mEq/L Final  . Potassium 12/09/2013 4.1  3.7 - 5.3 mEq/L Final  . Chloride 12/09/2013 102  96 - 112 mEq/L Final  . CO2 12/09/2013 26  19 - 32 mEq/L Final  . Glucose, Bld 12/09/2013 79  70 - 99 mg/dL Final  . BUN 12/09/2013 16  6 - 23 mg/dL Final  . Creatinine, Ser 12/09/2013 0.91  0.50 - 1.35 mg/dL Final  . Calcium 12/09/2013 9.4  8.4 - 10.5 mg/dL Final  . Total Protein 12/09/2013 7.3  6.0 - 8.3 g/dL Final  . Albumin 12/09/2013 3.6  3.5 - 5.2 g/dL Final  . AST 12/09/2013 19  0 - 37 U/L Final  . ALT 12/09/2013 12  0 - 53 U/L Final  . Alkaline Phosphatase 12/09/2013 103  39 - 117 U/L Final  . Total Bilirubin 12/09/2013 0.2* 0.3 - 1.2 mg/dL Final  . GFR calc non Af Amer 12/09/2013 84* >90 mL/min Final  . GFR calc Af Amer 12/09/2013 >90  >90 mL/min Final   Comment: (NOTE)                          The eGFR has been calculated using the CKD EPI equation.                          This calculation has not been validated in all clinical situations.                          eGFR's persistently <90 mL/min signify possible Chronic Kidney                          Disease.    PATHOLOGY: No new pathology.  Urinalysis No results found for this basename: colorurine,  appearanceur,  labspec,  phurine,  glucoseu,  hgbur,  bilirubinur,  ketonesur,  proteinur,  urobilinogen,  nitrite,  leukocytesur    RADIOGRAPHIC STUDIES: No results found.  ASSESSMENT:  1. 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 Prostate cancer status post radical prostatectomy in October 2004 now is slightly improved PSA. 3. 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  4. Port-A-Cath in place and working  well.    PLAN:  #1. Continue watchful expectation. #2. Try to decrease cigarette consumption by one cigarette per day until down to 3 or 4 per day. #3. Followup visit in July 2015 with CTs of the chest abdomen and pelvis with contrast prior to that visit.   All questions were answered. The patient knows to call the clinic with any problems, questions or concerns. We can certainly see the patient much sooner if necessary.   I spent 25 minutes counseling the patient face to face. The total time spent in the appointment was 30 minutes.    Farrel Gobble, MD 12/10/2013 11:41 AM  DISCLAIMER:  This note was dictated with voice recognition software.  Similar sounding words can inadvertently be transcribed inaccurately and may not be corrected upon review.

## 2013-12-16 ENCOUNTER — Encounter (HOSPITAL_COMMUNITY): Payer: Medicare Other

## 2014-01-27 ENCOUNTER — Encounter (HOSPITAL_COMMUNITY): Payer: Medicare Other | Attending: Hematology and Oncology

## 2014-01-27 DIAGNOSIS — C182 Malignant neoplasm of ascending colon: Secondary | ICD-10-CM

## 2014-01-27 DIAGNOSIS — C61 Malignant neoplasm of prostate: Secondary | ICD-10-CM | POA: Insufficient documentation

## 2014-01-27 DIAGNOSIS — C189 Malignant neoplasm of colon, unspecified: Secondary | ICD-10-CM | POA: Insufficient documentation

## 2014-01-27 LAB — COMPREHENSIVE METABOLIC PANEL
ALT: 12 U/L (ref 0–53)
AST: 14 U/L (ref 0–37)
Albumin: 3.8 g/dL (ref 3.5–5.2)
Alkaline Phosphatase: 107 U/L (ref 39–117)
BUN: 18 mg/dL (ref 6–23)
CALCIUM: 9.3 mg/dL (ref 8.4–10.5)
CO2: 26 mEq/L (ref 19–32)
Chloride: 102 mEq/L (ref 96–112)
Creatinine, Ser: 1.04 mg/dL (ref 0.50–1.35)
GFR calc Af Amer: 81 mL/min — ABNORMAL LOW (ref >90)
GFR, EST NON AFRICAN AMERICAN: 70 mL/min — AB (ref >90)
Glucose, Bld: 80 mg/dL (ref 70–99)
Potassium: 4.3 mEq/L (ref 3.7–5.3)
SODIUM: 139 meq/L (ref 137–147)
Total Bilirubin: 0.2 mg/dL — ABNORMAL LOW (ref 0.3–1.2)
Total Protein: 7.4 g/dL (ref 6.0–8.3)

## 2014-01-27 LAB — CBC WITH DIFFERENTIAL/PLATELET
Basophils Absolute: 0 10*3/uL (ref 0.0–0.1)
Basophils Relative: 0 % (ref 0–1)
EOS ABS: 0.2 10*3/uL (ref 0.0–0.7)
EOS PCT: 2 % (ref 0–5)
HCT: 45.6 % (ref 39.0–52.0)
HEMOGLOBIN: 15.3 g/dL (ref 13.0–17.0)
LYMPHS PCT: 37 % (ref 12–46)
Lymphs Abs: 4.3 10*3/uL — ABNORMAL HIGH (ref 0.7–4.0)
MCH: 30.2 pg (ref 26.0–34.0)
MCHC: 33.6 g/dL (ref 30.0–36.0)
MCV: 90.1 fL (ref 78.0–100.0)
Monocytes Absolute: 0.9 10*3/uL (ref 0.1–1.0)
Monocytes Relative: 8 % (ref 3–12)
NEUTROS PCT: 53 % (ref 43–77)
Neutro Abs: 6.1 10*3/uL (ref 1.7–7.7)
Platelets: 294 10*3/uL (ref 150–400)
RBC: 5.06 MIL/uL (ref 4.22–5.81)
RDW: 14.3 % (ref 11.5–15.5)
WBC: 11.5 10*3/uL — AB (ref 4.0–10.5)

## 2014-01-27 MED ORDER — HEPARIN SOD (PORK) LOCK FLUSH 100 UNIT/ML IV SOLN
500.0000 [IU] | Freq: Once | INTRAVENOUS | Status: AC
  Administered 2014-01-27: 500 [IU] via INTRAVENOUS
  Filled 2014-01-27: qty 5

## 2014-01-27 MED ORDER — SODIUM CHLORIDE 0.9 % IJ SOLN
10.0000 mL | INTRAMUSCULAR | Status: DC | PRN
  Administered 2014-01-27: 10 mL via INTRAVENOUS

## 2014-01-27 NOTE — Progress Notes (Signed)
Roberto [name] presented for Portacath access and flush. Proper placement of portacath confirmed by CXR. Portacath located lt chest wall accessed with  H 20 needle. Good blood return present. Portacath flushed with 25ml NS and 500U/1ml Heparin and needle removed intact. Prep given for ct scans next week. Procedure without incident. Patient tolerated procedure well.

## 2014-01-28 LAB — CEA: CEA: 4.2 ng/mL (ref 0.0–5.0)

## 2014-01-28 LAB — PSA: PSA: 1.26 ng/mL (ref <=4.00)

## 2014-02-05 ENCOUNTER — Encounter (HOSPITAL_COMMUNITY): Payer: Self-pay

## 2014-02-05 ENCOUNTER — Ambulatory Visit (HOSPITAL_COMMUNITY)
Admission: RE | Admit: 2014-02-05 | Discharge: 2014-02-05 | Disposition: A | Discharge status: Home / Self Care | Payer: Medicare Other | Source: Ambulatory Visit | Attending: Hematology and Oncology | Admitting: Hematology and Oncology

## 2014-02-05 DIAGNOSIS — Z8546 Personal history of malignant neoplasm of prostate: Secondary | ICD-10-CM | POA: Insufficient documentation

## 2014-02-05 DIAGNOSIS — E278 Other specified disorders of adrenal gland: Secondary | ICD-10-CM | POA: Insufficient documentation

## 2014-02-05 DIAGNOSIS — R091 Pleurisy: Secondary | ICD-10-CM | POA: Insufficient documentation

## 2014-02-05 DIAGNOSIS — C189 Malignant neoplasm of colon, unspecified: Secondary | ICD-10-CM | POA: Insufficient documentation

## 2014-02-05 DIAGNOSIS — I700 Atherosclerosis of aorta: Secondary | ICD-10-CM | POA: Insufficient documentation

## 2014-02-05 DIAGNOSIS — J438 Other emphysema: Secondary | ICD-10-CM | POA: Insufficient documentation

## 2014-02-05 DIAGNOSIS — Z77090 Contact with and (suspected) exposure to asbestos: Secondary | ICD-10-CM | POA: Insufficient documentation

## 2014-02-05 DIAGNOSIS — Z9049 Acquired absence of other specified parts of digestive tract: Secondary | ICD-10-CM | POA: Insufficient documentation

## 2014-02-05 DIAGNOSIS — Z9221 Personal history of antineoplastic chemotherapy: Secondary | ICD-10-CM | POA: Insufficient documentation

## 2014-02-05 MED ORDER — IOHEXOL 300 MG/ML  SOLN
100.0000 mL | Freq: Once | INTRAMUSCULAR | Status: AC | PRN
Start: 2014-02-05 — End: 2014-02-05
  Administered 2014-02-05: 100 mL via INTRAVENOUS

## 2014-02-09 ENCOUNTER — Encounter (HOSPITAL_COMMUNITY): Payer: Self-pay

## 2014-02-09 ENCOUNTER — Encounter (HOSPITAL_COMMUNITY): Payer: Medicare Other | Attending: Hematology and Oncology

## 2014-02-09 ENCOUNTER — Telehealth (HOSPITAL_COMMUNITY): Payer: Self-pay

## 2014-02-09 ENCOUNTER — Other Ambulatory Visit (HOSPITAL_COMMUNITY): Payer: Medicare Other

## 2014-02-09 VITALS — BP 161/73 | HR 69 | Temp 98.1°F | Resp 18 | Wt 194.2 lb

## 2014-02-09 DIAGNOSIS — Z8546 Personal history of malignant neoplasm of prostate: Secondary | ICD-10-CM

## 2014-02-09 DIAGNOSIS — J438 Other emphysema: Secondary | ICD-10-CM

## 2014-02-09 DIAGNOSIS — D751 Secondary polycythemia: Secondary | ICD-10-CM

## 2014-02-09 DIAGNOSIS — C189 Malignant neoplasm of colon, unspecified: Secondary | ICD-10-CM

## 2014-02-09 DIAGNOSIS — F17200 Nicotine dependence, unspecified, uncomplicated: Secondary | ICD-10-CM

## 2014-02-09 DIAGNOSIS — C61 Malignant neoplasm of prostate: Secondary | ICD-10-CM

## 2014-02-09 DIAGNOSIS — Z85038 Personal history of other malignant neoplasm of large intestine: Secondary | ICD-10-CM

## 2014-02-09 NOTE — Progress Notes (Signed)
[facility]  OFFICE PROGRESS NOTE  Roberto Mallet, MD [place] [address]  DIAGNOSIS: Prostate ca  Colon cancer  Other emphysema  Polycythemia, secondary  Chief Complaint  Patient presents with  . Colon Cancer  . Prostate Cancer    CURRENT THERAPY: Watchful dictation for colon cancer and prostate cancer.  INTERVAL HISTORY: Roberto [name] 73 y.o. male returns for followup while undergoing surveillance for colon cancer and prostate cancer currently monitoring PSA velocity.  He continues to do well except for peripheral paresthesias still involving the upper and lower extremities from previous chemotherapy. He does have trouble buttoning small buttons undershirt occasionally. Appetite is good with no nausea, vomiting, diarrhea, constipation, melena, hematochezia, hematuria, urinary hesitancy, hematuria, lower extremity swelling or redness, PND, orthopnea, palpitations, headache, breast pain, or seizures.   MEDICAL HISTORY: Past Medical History  Diagnosis Date  . Varicose veins of lower limb 1985    rt. leg  . 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 Prostate cancer     prostate 2004/surg  . Colon cancer     colon 2010/surg and chemo  . Port-a-cath in place 04/02/2013    INTERIM HISTORY: has Colon cancer and Port-a-cath in place on his problem list.    ALLERGIES:  is allergic to tetanus toxoids.  MEDICATIONS: has a current medication list which includes the following prescription(s): acetaminophen, aspirin, diclofenac, lidocaine-prilocaine, and lovastatin.  SURGICAL HISTORY:  Past Surgical History  Procedure Laterality Date  . Colon surgery    . Prostate surgery    . Styloid process excision    . Hand tendon surgery      lt. hand repair    FAMILY HISTORY: family history includes Cancer in his father.  SOCIAL HISTORY:  reports that he has been smoking.  He has never used smokeless tobacco. He  reports that he does not drink alcohol or use illicit drugs.  REVIEW OF SYSTEMS:  Other than that discussed above is noncontributory.  PHYSICAL EXAMINATION: ECOG PERFORMANCE STATUS: 1 - Symptomatic but completely ambulatory  Blood pressure 161/73, pulse 69, temperature 98.1 F (36.7 C), temperature source Oral, resp. rate 18, weight 194 lb 3.2 oz (88.089 kg), SpO2 97.00%.  GENERAL:alert, no distress and comfortable SKIN: skin color, texture, turgor are normal, no rashes or significant lesions EYES: PERLA; Conjunctiva are pink and non-injected, sclera clear SINUSES: No redness or tenderness over maxillary or ethmoid sinuses OROPHARYNX:no exudate, no erythema on lips, buccal mucosa, or tongue. NECK: supple, thyroid normal size, non-tender, without nodularity. No masses CHEST: Increased AP diameter with no breast masses. LYMPH:  no palpable lymphadenopathy in the cervical, axillary or inguinal LUNGS: clear to auscultation and percussion with normal breathing effort HEART: regular rate & rhythm and no murmurs. ABDOMEN:abdomen soft, non-tender and normal bowel sounds. No hepatomegaly, ascites, or CVA tenderness. MUSCULOSKELETAL:no cyanosis of digits and no clubbing. Range of motion normal.  NEURO: alert & oriented x 3 with fluent speech, no focal motor/sensory deficits   LABORATORY DATA:  Results for Roberto, [name] (MRN [id number]) as of 02/09/2014 09:58  Ref. Range 02/19/2013 13:30 09/01/2013 10:04 12/09/2013 12:39 01/27/2014 13:43  PSA Latest Range: <=4.00 ng/mL 0.94 1.08 1.02 1.26    Infusion on 01/27/2014  Component Date Value Ref Range Status  . WBC 01/27/2014 11.5* 4.0 - 10.5 K/uL Final  . RBC 01/27/2014 5.06  4.22 - 5.81 MIL/uL Final  . Hemoglobin 01/27/2014 15.3  13.0 -  17.0 g/dL Final  . HCT 01/27/2014 45.6  39.0 - 52.0 % Final  . MCV 01/27/2014 90.1  78.0 - 100.0 fL Final  . MCH 01/27/2014 30.2  26.0 - 34.0 pg Final  . MCHC 01/27/2014 33.6  30.0 - 36.0 g/dL Final  . RDW  01/27/2014 14.3  11.5 - 15.5 % Final  . Platelets 01/27/2014 294  150 - 400 K/uL Final  . Neutrophils Relative % 01/27/2014 53  43 - 77 % Final  . Lymphocytes Relative 01/27/2014 37  12 - 46 % Final  . Monocytes Relative 01/27/2014 8  3 - 12 % Final  . Eosinophils Relative 01/27/2014 2  0 - 5 % Final  . Basophils Relative 01/27/2014 0  0 - 1 % Final  . Neutro Abs 01/27/2014 6.1  1.7 - 7.7 K/uL Final  . Lymphs Abs 01/27/2014 4.3* 0.7 - 4.0 K/uL Final  . Monocytes Absolute 01/27/2014 0.9  0.1 - 1.0 K/uL Final  . Eosinophils Absolute 01/27/2014 0.2  0.0 - 0.7 K/uL Final  . Basophils Absolute 01/27/2014 0.0  0.0 - 0.1 K/uL Final  . WBC Morphology 01/27/2014 ATYPICAL LYMPHOCYTES   Final  . Smear Review 01/27/2014 LARGE PLATELETS PRESENT   Final   GIANT PLATELETS SEEN  . Sodium 01/27/2014 139  137 - 147 mEq/L Final  . Potassium 01/27/2014 4.3  3.7 - 5.3 mEq/L Final  . Chloride 01/27/2014 102  96 - 112 mEq/L Final  . CO2 01/27/2014 26  19 - 32 mEq/L Final  . Glucose, Bld 01/27/2014 80  70 - 99 mg/dL Final  . BUN 01/27/2014 18  6 - 23 mg/dL Final  . Creatinine, Ser 01/27/2014 1.04  0.50 - 1.35 mg/dL Final  . Calcium 01/27/2014 9.3  8.4 - 10.5 mg/dL Final  . Total Protein 01/27/2014 7.4  6.0 - 8.3 g/dL Final  . Albumin 01/27/2014 3.8  3.5 - 5.2 g/dL Final  . AST 01/27/2014 14  0 - 37 U/L Final  . ALT 01/27/2014 12  0 - 53 U/L Final  . Alkaline Phosphatase 01/27/2014 107  39 - 117 U/L Final  . Total Bilirubin 01/27/2014 0.2* 0.3 - 1.2 mg/dL Final  . GFR calc non Af Amer 01/27/2014 70* >90 mL/min Final  . GFR calc Af Amer 01/27/2014 81* >90 mL/min Final   Comment: (NOTE)                          The eGFR has been calculated using the CKD EPI equation.                          This calculation has not been validated in all clinical situations.                          eGFR's persistently <90 mL/min signify possible Chronic Kidney                          Disease.  . CEA 01/27/2014 4.2  0.0 -  5.0 ng/mL Final   Performed at [facility]  . PSA 01/27/2014 1.26  <=4.00 ng/mL Final   Comment: (NOTE)                          Test Methodology: ECLIA PSA (Electrochemiluminescence Immunoassay)  For PSA values from 2.5-4.0, particularly in younger men <60 years                          old, the AUA and NCCN suggest testing for % Free PSA (3515) and                          evaluation of the rate of increase in PSA (PSA velocity).                          Performed at [facility]: No new pathology.  Urinalysis No results found for this basename: colorurine,  appearanceur,  labspec,  phurine,  glucoseu,  hgbur,  bilirubinur,  ketonesur,  proteinur,  urobilinogen,  nitrite,  leukocytesur    RADIOGRAPHIC STUDIES: Ct Chest W Contrast  02/05/2014   CLINICAL DATA:  Restaging colon cancer. History of partial colon resection and chemotherapy in 2010. History of prostate cancer.  EXAM: CT CHEST, ABDOMEN, AND PELVIS WITH CONTRAST  TECHNIQUE: Multidetector CT imaging of the chest, abdomen and pelvis was performed following the standard protocol during bolus administration of intravenous contrast.  CONTRAST:  131m OMNIPAQUE IOHEXOL 300 MG/ML  SOLN  COMPARISON:  CT 02/24/2013 and 02/22/2012. Whole body bone scan 02/25/2013.  FINDINGS: CT CHEST FINDINGS  Left subclavian Port-A-Cath tip is unchanged in the lower SVC. Small mediastinal lymph nodes are stable, not pathologically enlarged. There is stable atherosclerosis of the aorta, great vessels and coronary arteries.  There is no significant pleural or pericardial effusion. The trachea and thyroid gland appear unremarkable.  Again demonstrated is emphysema and scattered calcified pleural plaque formation bilaterally. The 3 mm right upper lobe subpleural nodule on image 19 is stable. No new or enlarging nodules are identified.  CT ABDOMEN AND PELVIS FINDINGS  Subcapsular calcification inferiorly in the  right hepatic lobe on image 65 is stable. There are no new or enlarging liver lesions. Ill-defined subcapsular low-density posteriorly in the spleen is stable.  There is stable bilateral adrenal hyperplasia. The gallbladder, pancreas and kidneys demonstrate no significant findings.  There are stable postsurgical changes following right hemicolectomy. The anastomosis appears stable. There is no adenopathy, ascites or peritoneal nodularity. The prostatectomy bed has a stable appearance without recurrent mass. The bladder appears unremarkable, although is nearly empty. There is stable atherosclerosis of the aorta, its branches and iliac arteries. There is a stable small right inguinal hernia containing fat.  There are stable mild degenerative changes throughout the spine. No worrisome osseous findings are demonstrated.  IMPRESSION:  1. Overall stable examinations of the chest, abdomen and pelvis. No evidence of metastatic colon or prostate cancer. 2. 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 Small right apical nodule and emphysematous changes are stable. 3. Stable adrenal hyperplasia.   Electronically Signed   By: BCamie PatienceM.D.   On: 02/05/2014 13:02   Ct Abdomen Pelvis W Contrast  02/05/2014   CLINICAL DATA:  Restaging colon cancer. History of partial colon resection and chemotherapy in 2010. History of prostate cancer.  EXAM: CT CHEST, ABDOMEN, AND PELVIS WITH CONTRAST  TECHNIQUE: Multidetector CT imaging of the chest, abdomen and pelvis was performed following the standard protocol during bolus administration of intravenous contrast.  CONTRAST:  107mOMNIPAQUE IOHEXOL 300 MG/ML  SOLN  COMPARISON:  CT 02/24/2013 and 02/22/2012. Whole body bone scan 02/25/2013.  FINDINGS: CT  CHEST FINDINGS  Left subclavian Port-A-Cath tip is unchanged in the lower SVC. Small mediastinal lymph nodes are stable, not pathologically enlarged. There is stable atherosclerosis of the aorta, great vessels and  coronary arteries.  There is no significant pleural or pericardial effusion. The trachea and thyroid gland appear unremarkable.  Again demonstrated is emphysema and scattered calcified pleural plaque formation bilaterally. The 3 mm right upper lobe subpleural nodule on image 19 is stable. No new or enlarging nodules are identified.  CT ABDOMEN AND PELVIS FINDINGS  Subcapsular calcification inferiorly in the right hepatic lobe on image 65 is stable. There are no new or enlarging liver lesions. Ill-defined subcapsular low-density posteriorly in the spleen is stable.  There is stable bilateral adrenal hyperplasia. The gallbladder, pancreas and kidneys demonstrate no significant findings.  There are stable postsurgical changes following right hemicolectomy. The anastomosis appears stable. There is no adenopathy, ascites or peritoneal nodularity. The prostatectomy bed has a stable appearance without recurrent mass. The bladder appears unremarkable, although is nearly empty. There is stable atherosclerosis of the aorta, its branches and iliac arteries. There is a stable small right inguinal hernia containing fat.  There are stable mild degenerative changes throughout the spine. No worrisome osseous findings are demonstrated.  IMPRESSION:  1. Overall stable examinations of the chest, abdomen and pelvis. No evidence of metastatic colon or prostate cancer. 2. 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 Small right apical nodule and emphysematous changes are stable. 3. Stable adrenal hyperplasia.   Electronically Signed   By: Camie Patience M.D.   On: 02/05/2014 13:02    ASSESSMENT:  1. 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 Prostate cancer status post radical prostatectomy in October 2004, slow location of PSA but with small velocity..  3. Chronic obstructive pulmonary  disease with secondary polycythemia.  4. Port-A-Cath in place and working well    PLAN:  1. Continue watchful expectation.  #2. Try to decrease cigarette consumption by one cigarette per day until down to 3 or 4 per day. #3. Followup in 3 months with CBC, chem profile, CEA.    All questions were answered. The patient knows to call the clinic with any problems, questions or concerns. We can certainly see the patient much sooner if necessary.   I spent 25 minutes counseling the patient face to face. The total time spent in the appointment was 30 minutes.    Doroteo Bradford, MD 02/09/2014 10:10 AM  DISCLAIMER:  This note was dictated with voice recognition software.  Similar sounding words can inadvertently be transcribed inaccurately and may not be corrected upon review.

## 2014-02-09 NOTE — Patient Instructions (Signed)
[facility] Discharge Instructions  RECOMMENDATIONS MADE BY THE CONSULTANT AND ANY TEST RESULTS WILL BE SENT TO YOUR REFERRING PHYSICIAN.  EXAM FINDINGS BY THE PHYSICIAN TODAY AND SIGNS OR SYMPTOMS TO REPORT TO CLINIC OR PRIMARY PHYSICIAN: You saw Dr Barnet Glasgow today  Keep appts as scheduled for port flushes.  Follow up with the doctor in 3 months and with port flush and labs  Thank you for choosing [facility] to provide your oncology and hematology care.  To afford each patient quality time with our providers, please arrive at least 15 minutes before your scheduled appointment time.  With your help, our goal is to use those 15 minutes to complete the necessary work-up to ensure our physicians have the information they need to help with your evaluation and healthcare recommendations.    Effective January 1st, 2014, we ask that you re-schedule your appointment with our physicians should you arrive 10 or more minutes late for your appointment.  We strive to give you quality time with our providers, and arriving late affects you and other patients whose appointments are after yours.    Again, thank you for choosing [facility].  Our hope is that these requests will decrease the amount of time that you wait before being seen by our physicians.       _____________________________________________________________  Should you have questions after your visit to [facility], please contact our office at [phone] between the hours of 8:30 a.m. and 5:00 p.m.  Voicemails left after 4:30 p.m. will not be returned until the following business day.  For prescription refill requests, have your pharmacy contact our office with your prescription refill request.

## 2014-02-09 NOTE — Telephone Encounter (Signed)
Message copied by Mellissa Kohut on Mon Feb 09, 2014  9:50 AM ------      Message from: Farrel Gobble A      Created: Thu Feb 05, 2014  1:30 PM       Please notify Mr. Crays that his CAT scans were without evidence of cancer. Thank you ------

## 2014-03-10 ENCOUNTER — Encounter (HOSPITAL_COMMUNITY): Payer: Medicare Other | Attending: Hematology and Oncology

## 2014-03-10 DIAGNOSIS — Z95828 Presence of other vascular implants and grafts: Secondary | ICD-10-CM

## 2014-03-10 DIAGNOSIS — Z452 Encounter for adjustment and management of vascular access device: Secondary | ICD-10-CM

## 2014-03-10 DIAGNOSIS — Z9889 Other specified postprocedural states: Secondary | ICD-10-CM | POA: Insufficient documentation

## 2014-03-10 DIAGNOSIS — Z85038 Personal history of other malignant neoplasm of large intestine: Secondary | ICD-10-CM

## 2014-03-10 MED ORDER — SODIUM CHLORIDE 0.9 % IJ SOLN
10.0000 mL | INTRAMUSCULAR | Status: DC | PRN
  Administered 2014-03-10: 10 mL via INTRAVENOUS

## 2014-03-10 MED ORDER — HEPARIN SOD (PORK) LOCK FLUSH 100 UNIT/ML IV SOLN
INTRAVENOUS | Status: AC
  Filled 2014-03-10: qty 5

## 2014-03-10 MED ORDER — HEPARIN SOD (PORK) LOCK FLUSH 100 UNIT/ML IV SOLN
500.0000 [IU] | Freq: Once | INTRAVENOUS | Status: AC
  Administered 2014-03-10: 500 [IU] via INTRAVENOUS

## 2014-03-10 NOTE — Progress Notes (Signed)
Roberto [name] presented for Portacath access and flush.  .  Portacath located left  chest wall accessed with  H 20 needle.  Good blood return present. Portacath flushed with 20ml NS and 500U/5ml Heparin and needle removed intact.  Procedure tolerated well and without incident.   

## 2014-04-21 ENCOUNTER — Encounter (HOSPITAL_COMMUNITY): Payer: Medicare Other | Attending: Hematology and Oncology

## 2014-04-21 VITALS — BP 140/65 | HR 69 | Temp 98.0°F | Resp 18

## 2014-04-21 DIAGNOSIS — Z9889 Other specified postprocedural states: Secondary | ICD-10-CM | POA: Insufficient documentation

## 2014-04-21 DIAGNOSIS — Z23 Encounter for immunization: Secondary | ICD-10-CM

## 2014-04-21 DIAGNOSIS — C61 Malignant neoplasm of prostate: Secondary | ICD-10-CM

## 2014-04-21 DIAGNOSIS — Z95828 Presence of other vascular implants and grafts: Secondary | ICD-10-CM

## 2014-04-21 MED ORDER — INFLUENZA VAC SPLIT QUAD 0.5 ML IM SUSY
0.5000 mL | PREFILLED_SYRINGE | Freq: Once | INTRAMUSCULAR | Status: AC
  Administered 2014-04-21: 0.5 mL via INTRAMUSCULAR

## 2014-04-21 MED ORDER — HEPARIN SOD (PORK) LOCK FLUSH 100 UNIT/ML IV SOLN
500.0000 [IU] | Freq: Once | INTRAVENOUS | Status: AC
  Administered 2014-04-21: 500 [IU] via INTRAVENOUS

## 2014-04-21 MED ORDER — SODIUM CHLORIDE 0.9 % IJ SOLN
10.0000 mL | INTRAMUSCULAR | Status: DC | PRN
  Administered 2014-04-21: 10 mL via INTRAVENOUS

## 2014-04-21 MED ORDER — HEPARIN SOD (PORK) LOCK FLUSH 100 UNIT/ML IV SOLN
INTRAVENOUS | Status: AC
  Filled 2014-04-21: qty 5

## 2014-04-21 NOTE — Patient Instructions (Signed)
[place] Discharge Instructions  RECOMMENDATIONS MADE BY THE CONSULTANT AND ANY TEST RESULTS WILL BE SENT TO YOUR REFERRING PHYSICIAN.  You had a port flush today.  Please call if you have any concerns.  Thank you for choosing [facility] to provide your oncology and hematology care.  To afford each patient quality time with our providers, please arrive at least 15 minutes before your scheduled appointment time.  With your help, our goal is to use those 15 minutes to complete the necessary work-up to ensure our physicians have the information they need to help with your evaluation and healthcare recommendations.    Effective January 1st, 2014, we ask that you re-schedule your appointment with our physicians should you arrive 10 or more minutes late for your appointment.  We strive to give you quality time with our providers, and arriving late affects you and other patients whose appointments are after yours.    Again, thank you for choosing [facility].  Our hope is that these requests will decrease the amount of time that you wait before being seen by our physicians.       _____________________________________________________________  Should you have questions after your visit to [facility], please contact our office at [phone] between the hours of 8:30 a.m. and 5:00 p.m.  Voicemails left after 4:30 p.m. will not be returned until the following business day.  For prescription refill requests, have your pharmacy contact our office with your prescription refill request.

## 2014-04-21 NOTE — Progress Notes (Signed)
Roberto [name] presented for Portacath access and flush.  Proper placement of portacath confirmed by CXR.  Portacath located left  chest wall accessed with  H 20 needle.  Good blood return present. Portacath flushed with 56ml NS and 500U/75ml Heparin and needle removed intact.  Procedure tolerated well and without incident.    Also received fluarix while in clinic

## 2014-06-02 ENCOUNTER — Encounter (HOSPITAL_COMMUNITY): Payer: Self-pay

## 2014-06-02 ENCOUNTER — Encounter (HOSPITAL_COMMUNITY): Payer: Medicare Other | Attending: Hematology and Oncology

## 2014-06-02 ENCOUNTER — Encounter (HOSPITAL_BASED_OUTPATIENT_CLINIC_OR_DEPARTMENT_OTHER): Payer: Medicare Other

## 2014-06-02 VITALS — BP 161/90 | HR 78 | Temp 98.1°F | Resp 20 | Wt 190.4 lb

## 2014-06-02 DIAGNOSIS — D751 Secondary polycythemia: Secondary | ICD-10-CM

## 2014-06-02 DIAGNOSIS — Z9889 Other specified postprocedural states: Secondary | ICD-10-CM | POA: Diagnosis not present

## 2014-06-02 DIAGNOSIS — C61 Malignant neoplasm of prostate: Secondary | ICD-10-CM

## 2014-06-02 DIAGNOSIS — J449 Chronic obstructive pulmonary disease, unspecified: Secondary | ICD-10-CM

## 2014-06-02 DIAGNOSIS — C189 Malignant neoplasm of colon, unspecified: Secondary | ICD-10-CM

## 2014-06-02 DIAGNOSIS — J438 Other emphysema: Secondary | ICD-10-CM

## 2014-06-02 DIAGNOSIS — Z95828 Presence of other vascular implants and grafts: Secondary | ICD-10-CM

## 2014-06-02 DIAGNOSIS — Z8546 Personal history of malignant neoplasm of prostate: Secondary | ICD-10-CM

## 2014-06-02 DIAGNOSIS — R209 Unspecified disturbances of skin sensation: Secondary | ICD-10-CM

## 2014-06-02 DIAGNOSIS — Z85038 Personal history of other malignant neoplasm of large intestine: Secondary | ICD-10-CM

## 2014-06-02 DIAGNOSIS — J61 Pneumoconiosis due to asbestos and other mineral fibers: Secondary | ICD-10-CM

## 2014-06-02 LAB — CBC WITH DIFFERENTIAL/PLATELET
Basophils Absolute: 0 10*3/uL (ref 0.0–0.1)
Basophils Relative: 0 % (ref 0–1)
EOS PCT: 1 % (ref 0–5)
Eosinophils Absolute: 0.1 10*3/uL (ref 0.0–0.7)
HCT: 44.6 % (ref 39.0–52.0)
HEMOGLOBIN: 15 g/dL (ref 13.0–17.0)
LYMPHS PCT: 25 % (ref 12–46)
Lymphs Abs: 2.6 10*3/uL (ref 0.7–4.0)
MCH: 30.2 pg (ref 26.0–34.0)
MCHC: 33.6 g/dL (ref 30.0–36.0)
MCV: 89.9 fL (ref 78.0–100.0)
MONO ABS: 0.8 10*3/uL (ref 0.1–1.0)
Monocytes Relative: 8 % (ref 3–12)
NEUTROS ABS: 6.6 10*3/uL (ref 1.7–7.7)
Neutrophils Relative %: 66 % (ref 43–77)
Platelets: 289 10*3/uL (ref 150–400)
RBC: 4.96 MIL/uL (ref 4.22–5.81)
RDW: 13.5 % (ref 11.5–15.5)
WBC: 10.1 10*3/uL (ref 4.0–10.5)

## 2014-06-02 LAB — COMPREHENSIVE METABOLIC PANEL
ALK PHOS: 112 U/L (ref 39–117)
ALT: 9 U/L (ref 0–53)
AST: 12 U/L (ref 0–37)
Albumin: 3.6 g/dL (ref 3.5–5.2)
Anion gap: 11 (ref 5–15)
BUN: 12 mg/dL (ref 6–23)
CO2: 27 meq/L (ref 19–32)
Calcium: 9.2 mg/dL (ref 8.4–10.5)
Chloride: 99 mEq/L (ref 96–112)
Creatinine, Ser: 0.88 mg/dL (ref 0.50–1.35)
GFR, EST NON AFRICAN AMERICAN: 84 mL/min — AB (ref >90)
GLUCOSE: 99 mg/dL (ref 70–99)
POTASSIUM: 4.4 meq/L (ref 3.7–5.3)
Sodium: 137 mEq/L (ref 137–147)
Total Bilirubin: 0.3 mg/dL (ref 0.3–1.2)
Total Protein: 7.4 g/dL (ref 6.0–8.3)

## 2014-06-02 MED ORDER — SODIUM CHLORIDE 0.9 % IJ SOLN
10.0000 mL | Freq: Once | INTRAMUSCULAR | Status: AC
  Administered 2014-06-02: 10 mL via INTRAVENOUS

## 2014-06-02 MED ORDER — HEPARIN SOD (PORK) LOCK FLUSH 100 UNIT/ML IV SOLN
INTRAVENOUS | Status: AC
  Filled 2014-06-02: qty 5

## 2014-06-02 MED ORDER — HEPARIN SOD (PORK) LOCK FLUSH 100 UNIT/ML IV SOLN
500.0000 [IU] | Freq: Once | INTRAVENOUS | Status: AC
  Administered 2014-06-02: 500 [IU] via INTRAVENOUS

## 2014-06-02 NOTE — Patient Instructions (Signed)
[place] Discharge Instructions  RECOMMENDATIONS MADE BY THE CONSULTANT AND ANY TEST RESULTS WILL BE SENT TO YOUR REFERRING PHYSICIAN.  EXAM FINDINGS BY THE PHYSICIAN TODAY AND SIGNS OR SYMPTOMS TO REPORT TO CLINIC OR PRIMARY PHYSICIAN: Exam and findings as discussed by Dr.Formanek.  MEDICATIONS PRESCRIBED:  Continue as prescribed.  INSTRUCTIONS/FOLLOW-UP: Port flush and lab work today. We will call you if there are any abnormal lab results. Continue port flush every 6 weeks. MD appointment again in 3 months.  Thank you for choosing [facility] to provide your oncology and hematology care.  To afford each patient quality time with our providers, please arrive at least 15 minutes before your scheduled appointment time.  With your help, our goal is to use those 15 minutes to complete the necessary work-up to ensure our physicians have the information they need to help with your evaluation and healthcare recommendations.    Effective January 1st, 2014, we ask that you re-schedule your appointment with our physicians should you arrive 10 or more minutes late for your appointment.  We strive to give you quality time with our providers, and arriving late affects you and other patients whose appointments are after yours.    Again, thank you for choosing [facility].  Our hope is that these requests will decrease the amount of time that you wait before being seen by our physicians.       _____________________________________________________________  Should you have questions after your visit to [facility], please contact our office at [phone] between the hours of 8:30 a.m. and 4:30 p.m.  Voicemails left after 4:30 p.m. will not be returned until the following business day.  For prescription refill requests, have your pharmacy contact our office with your prescription refill request.     _______________________________________________________________  We hope that we have given you very good care.  You may receive a patient satisfaction survey in the mail, please complete it and return it as soon as possible.  We value your feedback!  _______________________________________________________________  Have you asked about our STAR program?  STAR stands for Survivorship Training and Rehabilitation, and this is a nationally recognized cancer care program that focuses on survivorship and rehabilitation.  Cancer and cancer treatments may cause problems, such as, pain, making you feel tired and keeping you from doing the things that you need or want to do. Cancer rehabilitation can help. Our goal is to reduce these troubling effects and help you have the best quality of life possible.  You may receive a survey from a nurse that asks questions about your current state of health.  Based on the survey results, all eligible patients will be referred to the [facility] program for an evaluation so we can better serve you!  A frequently asked questions sheet is available upon request.

## 2014-06-02 NOTE — Progress Notes (Signed)
Roberto [name] presented for Portacath access and flush. Proper placement of portacath confirmed by CXR. Portacath located left chest wall accessed with  H 20 needle. Good blood return present. Portacath flushed with 20ml NS and 500U/5ml Heparin and needle removed intact. Procedure without incident. Patient tolerated procedure well.   

## 2014-06-02 NOTE — Progress Notes (Signed)
[facility]  OFFICE PROGRESS NOTE  [name], MD [place] [id number]  DIAGNOSIS: Colon cancer  Prostate ca - Plan: CBC with Differential, PSA, CEA, Comprehensive metabolic panel  Other emphysema  Pulmonary asbestosis  Chief Complaint  Patient presents with  . Colon Cancer  . Prostate Cancer    CURRENT THERAPY: Watchful expectation for colon cancer and prostate cancer.  INTERVAL HISTORY: Roberto [name] 73 y.o. male returns for follow-up while undergoing surveillance for colon cancer and prostate cancer by monitoring PSA velocity as well as CT scans, last performed on 02/05/2014 at which time no evidence of disease was found but pulmonary asbestosis was documented.  Residual paresthesias still involving the distal portions of both upper extremities and the bottoms of his feet. He become painful as the day goes on. Bowel movements are regular with no melena, hematochezia, diarrhea, or constipation. No urinary urgency, frequency, incontinence, or hematuria. Appetite is good with no nausea, vomiting, abdominal distention, cough, wheezing, sore throat, skin rash, headache, or seizures. Patient has received influenza virus vaccine.  MEDICAL HISTORY: Past Medical History  Diagnosis Date  . Varicose veins of lower limb 1985    rt. leg  . 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 Prostate cancer     prostate 2004/surg  . Colon cancer     colon 2010/surg and chemo  . Port-a-cath in place 04/02/2013    INTERIM HISTORY: has Colon cancer and Port-a-cath in place on his problem list.     Colon cancer   05/30/2011 Initial Diagnosis Colon cancer    ALLERGIES:  is allergic to tetanus toxoids.  MEDICATIONS: has a current medication list which includes the following prescription(s): acetaminophen, aspirin, diclofenac, and lidocaine-prilocaine.  SURGICAL HISTORY:  Past Surgical History  Procedure Laterality Date    . Colon surgery    . Prostate surgery    . Styloid process excision    . Hand tendon surgery      lt. hand repair    FAMILY HISTORY: family history includes Cancer in his father.  SOCIAL HISTORY:  reports that he has been smoking.  He has never used smokeless tobacco. He reports that he does not drink alcohol or use illicit drugs.  REVIEW OF SYSTEMS:  Other than that discussed above is noncontributory.  PHYSICAL EXAMINATION: ECOG PERFORMANCE STATUS: 1 - Symptomatic but completely ambulatory  Blood pressure 161/90, pulse 78, temperature 98.1 F (36.7 C), temperature source Oral, resp. rate 20, weight 190 lb 6.4 oz (86.365 kg), SpO2 98.00%.  GENERAL:alert, no distress and comfortable SKIN: skin color, texture, turgor are normal, no rashes or significant lesions EYES: PERLA; Conjunctiva are pink and non-injected, sclera clear SINUSES: No redness or tenderness over maxillary or ethmoid sinuses OROPHARYNX:no exudate, no erythema on lips, buccal mucosa, or tongue. NECK: supple, thyroid normal size, non-tender, without nodularity. No masses CHEST: Increased AP diameter with no gynecomastia. Life port placed. LYMPH:  no palpable lymphadenopathy in the cervical, axillary or inguinal LUNGS: clear to auscultation and percussion with normal breathing effort HEART: regular rate & rhythm and no murmurs. ABDOMEN:abdomen soft, non-tender and normal bowel sounds. Well-healed surgical scar with no hepatosplenomegaly, ascites, or CVA tenderness. MUSCULOSKELETAL:no cyanosis of digits and no clubbing. Range of motion normal.  NEURO: alert & oriented x 3 with fluent speech, no focal motor/sensory deficits. Deep tendon reflexes normal.   LABORATORY DATA: Office Visit on 06/02/2014  Component Date Value Ref  Range Status  . WBC 06/02/2014 10.1  4.0 - 10.5 K/uL Final  . RBC 06/02/2014 4.96  4.22 - 5.81 MIL/uL Final  . Hemoglobin 06/02/2014 15.0  13.0 - 17.0 g/dL Final  . HCT 06/02/2014 44.6  39.0 -  52.0 % Final  . MCV 06/02/2014 89.9  78.0 - 100.0 fL Final  . MCH 06/02/2014 30.2  26.0 - 34.0 pg Final  . MCHC 06/02/2014 33.6  30.0 - 36.0 g/dL Final  . RDW 06/02/2014 13.5  11.5 - 15.5 % Final  . Platelets 06/02/2014 289  150 - 400 K/uL Final  . Neutrophils Relative % 06/02/2014 66  43 - 77 % Final  . Neutro Abs 06/02/2014 6.6  1.7 - 7.7 K/uL Final  . Lymphocytes Relative 06/02/2014 25  12 - 46 % Final  . Lymphs Abs 06/02/2014 2.6  0.7 - 4.0 K/uL Final  . Monocytes Relative 06/02/2014 8  3 - 12 % Final  . Monocytes Absolute 06/02/2014 0.8  0.1 - 1.0 K/uL Final  . Eosinophils Relative 06/02/2014 1  0 - 5 % Final  . Eosinophils Absolute 06/02/2014 0.1  0.0 - 0.7 K/uL Final  . Basophils Relative 06/02/2014 0  0 - 1 % Final  . Basophils Absolute 06/02/2014 0.0  0.0 - 0.1 K/uL Final    PATHOLOGY: No new pathology.  Urinalysis No results found for this basename: colorurine,  appearanceur,  labspec,  phurine,  glucoseu,  hgbur,  bilirubinur,  ketonesur,  proteinur,  urobilinogen,  nitrite,  leukocytesur    RADIOGRAPHIC STUDIES: No results found.  ASSESSMENT:  1. 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 2. Prostate cancer status post radical prostatectomy in October 2004, low PSA velocity..  3. 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  4. Port-A-Cath in place and working well . 5. Pulmonary asbestosis.      PLAN:  1. Continue watchful expectation with port flush today.  #2. Try to decrease cigarette consumption by one cigarette per day until down to 3 or 4 per day.  #3. Followup in 3 months with CBC, chem profile, CEA    All questions were answered. The patient knows to call the clinic with any problems, questions or concerns. We can certainly see the patient much  sooner if necessary.   I spent 25 minutes counseling the patient face to face. The total time spent in the appointment was 30 minutes.    Doroteo Bradford, MD 06/02/2014 11:05 AM  DISCLAIMER:  This note was dictated with voice recognition software.  Similar sounding words can inadvertently be transcribed inaccurately and may not be corrected upon review.

## 2014-06-03 LAB — CEA: CEA: 4.4 ng/mL (ref 0.0–5.0)

## 2014-06-03 LAB — PSA: PSA: 1.42 ng/mL (ref <=4.00)

## 2014-07-14 ENCOUNTER — Encounter (HOSPITAL_COMMUNITY): Payer: Self-pay

## 2014-07-14 ENCOUNTER — Encounter (HOSPITAL_COMMUNITY): Payer: Medicare Other | Attending: Hematology and Oncology

## 2014-07-14 VITALS — BP 148/66 | HR 64 | Temp 98.1°F | Resp 20

## 2014-07-14 DIAGNOSIS — Z8546 Personal history of malignant neoplasm of prostate: Secondary | ICD-10-CM

## 2014-07-14 DIAGNOSIS — Z9889 Other specified postprocedural states: Secondary | ICD-10-CM | POA: Insufficient documentation

## 2014-07-14 DIAGNOSIS — C61 Malignant neoplasm of prostate: Secondary | ICD-10-CM | POA: Insufficient documentation

## 2014-07-14 DIAGNOSIS — Z85038 Personal history of other malignant neoplasm of large intestine: Secondary | ICD-10-CM

## 2014-07-14 DIAGNOSIS — Z452 Encounter for adjustment and management of vascular access device: Secondary | ICD-10-CM

## 2014-07-14 DIAGNOSIS — Z95828 Presence of other vascular implants and grafts: Secondary | ICD-10-CM

## 2014-07-14 MED ORDER — HEPARIN SOD (PORK) LOCK FLUSH 100 UNIT/ML IV SOLN
500.0000 [IU] | Freq: Once | INTRAVENOUS | Status: AC
  Administered 2014-07-14: 500 [IU] via INTRAVENOUS
  Filled 2014-07-14: qty 5

## 2014-07-14 MED ORDER — SODIUM CHLORIDE 0.9 % IJ SOLN
10.0000 mL | INTRAMUSCULAR | Status: DC | PRN
  Administered 2014-07-14: 10 mL via INTRAVENOUS
  Filled 2014-07-14: qty 10

## 2014-07-14 NOTE — Progress Notes (Signed)
Elmer Sow presented for Portacath access and flush. Proper placement of portacath confirmed by CXR. Portacath located lt chest wall accessed with  H 20 needle. No blood return and flushes easily Portacath flushed with 31ml NS and 500U/46ml Heparin and needle removed intact. Procedure without incident. Patient tolerated procedure well.

## 2014-08-31 ENCOUNTER — Encounter (HOSPITAL_COMMUNITY): Payer: Medicare Other | Attending: Hematology and Oncology

## 2014-08-31 ENCOUNTER — Encounter (HOSPITAL_COMMUNITY): Payer: Self-pay

## 2014-08-31 VITALS — BP 134/72 | HR 93 | Temp 98.7°F | Resp 18

## 2014-08-31 DIAGNOSIS — Z452 Encounter for adjustment and management of vascular access device: Secondary | ICD-10-CM

## 2014-08-31 DIAGNOSIS — Z9889 Other specified postprocedural states: Secondary | ICD-10-CM | POA: Diagnosis not present

## 2014-08-31 DIAGNOSIS — C189 Malignant neoplasm of colon, unspecified: Secondary | ICD-10-CM

## 2014-08-31 DIAGNOSIS — Z85038 Personal history of other malignant neoplasm of large intestine: Secondary | ICD-10-CM

## 2014-08-31 DIAGNOSIS — C61 Malignant neoplasm of prostate: Secondary | ICD-10-CM | POA: Insufficient documentation

## 2014-08-31 LAB — CBC WITH DIFFERENTIAL/PLATELET
Basophils Absolute: 0 10*3/uL (ref 0.0–0.1)
Basophils Relative: 0 % (ref 0–1)
EOS ABS: 0.1 10*3/uL (ref 0.0–0.7)
Eosinophils Relative: 1 % (ref 0–5)
HCT: 45.6 % (ref 39.0–52.0)
HEMOGLOBIN: 14.5 g/dL (ref 13.0–17.0)
LYMPHS ABS: 3.6 10*3/uL (ref 0.7–4.0)
LYMPHS PCT: 27 % (ref 12–46)
MCH: 29.1 pg (ref 26.0–34.0)
MCHC: 31.8 g/dL (ref 30.0–36.0)
MCV: 91.4 fL (ref 78.0–100.0)
Monocytes Absolute: 1.1 10*3/uL — ABNORMAL HIGH (ref 0.1–1.0)
Monocytes Relative: 9 % (ref 3–12)
NEUTROS ABS: 8.4 10*3/uL — AB (ref 1.7–7.7)
NEUTROS PCT: 63 % (ref 43–77)
Platelets: 306 10*3/uL (ref 150–400)
RBC: 4.99 MIL/uL (ref 4.22–5.81)
RDW: 14.2 % (ref 11.5–15.5)
WBC: 13.3 10*3/uL — ABNORMAL HIGH (ref 4.0–10.5)

## 2014-08-31 LAB — COMPREHENSIVE METABOLIC PANEL
ALK PHOS: 87 U/L (ref 39–117)
ALT: 14 U/L (ref 0–53)
ANION GAP: 6 (ref 5–15)
AST: 17 U/L (ref 0–37)
Albumin: 4.1 g/dL (ref 3.5–5.2)
BILIRUBIN TOTAL: 0.5 mg/dL (ref 0.3–1.2)
BUN: 18 mg/dL (ref 6–23)
CHLORIDE: 103 mmol/L (ref 96–112)
CO2: 26 mmol/L (ref 19–32)
Calcium: 9 mg/dL (ref 8.4–10.5)
Creatinine, Ser: 1.19 mg/dL (ref 0.50–1.35)
GFR calc Af Amer: 69 mL/min — ABNORMAL LOW (ref >90)
GFR, EST NON AFRICAN AMERICAN: 60 mL/min — AB (ref >90)
Glucose, Bld: 83 mg/dL (ref 70–99)
POTASSIUM: 4.2 mmol/L (ref 3.5–5.1)
Sodium: 135 mmol/L (ref 135–145)
Total Protein: 7.3 g/dL (ref 6.0–8.3)

## 2014-08-31 MED ORDER — HEPARIN SOD (PORK) LOCK FLUSH 100 UNIT/ML IV SOLN
500.0000 [IU] | Freq: Once | INTRAVENOUS | Status: AC
  Administered 2014-08-31: 500 [IU] via INTRAVENOUS
  Filled 2014-08-31: qty 5

## 2014-08-31 MED ORDER — SODIUM CHLORIDE 0.9 % IJ SOLN
10.0000 mL | INTRAMUSCULAR | Status: DC | PRN
  Administered 2014-08-31: 10 mL via INTRAVENOUS
  Filled 2014-08-31: qty 10

## 2014-08-31 NOTE — Progress Notes (Signed)
Roberto [name] presented for Portacath access and flush.  .  Portacath located left  chest wall accessed with  H 20 needle.  Good blood return present. Portacath flushed with 59ml NS and 500U/3ml Heparin and needle removed intact.  Procedure tolerated well and without incident.

## 2014-08-31 NOTE — Patient Instructions (Signed)
[place] at [facility] Discharge Instructions  RECOMMENDATIONS MADE BY THE CONSULTANT AND ANY TEST RESULTS WILL BE SENT TO YOUR REFERRING PHYSICIAN.  Your port was flushed today. Please follow up as scheduled.   Thank you for choosing [place] at [facility] to provide your oncology and hematology care.  To afford each patient quality time with our provider, please arrive at least 15 minutes before your scheduled appointment time.    You need to re-schedule your appointment should you arrive 10 or more minutes late.  We strive to give you quality time with our providers, and arriving late affects you and other patients whose appointments are after yours.  Also, if you no show three or more times for appointments you may be dismissed from the clinic at the providers discretion.     Again, thank you for choosing [facility].  Our hope is that these requests will decrease the amount of time that you wait before being seen by our physicians.       _____________________________________________________________  Should you have questions after your visit to [facility], please contact our office at [phone] between the hours of 8:30 a.m. and 4:30 p.m.  Voicemails left after 4:30 p.m. will not be returned until the following business day.  For prescription refill requests, have your pharmacy contact our office.

## 2014-09-01 LAB — CEA: CEA: 5.7 ng/mL — ABNORMAL HIGH (ref 0.0–4.7)

## 2014-09-01 LAB — PSA: PSA: 1.42 ng/mL (ref <=4.00)

## 2014-09-02 ENCOUNTER — Encounter (INDEPENDENT_AMBULATORY_CARE_PROVIDER_SITE_OTHER): Payer: Self-pay | Admitting: *Deleted

## 2014-09-02 ENCOUNTER — Encounter (HOSPITAL_COMMUNITY): Payer: Self-pay | Admitting: Hematology & Oncology

## 2014-09-02 ENCOUNTER — Other Ambulatory Visit (HOSPITAL_COMMUNITY): Payer: Medicare Other

## 2014-09-02 ENCOUNTER — Encounter (HOSPITAL_BASED_OUTPATIENT_CLINIC_OR_DEPARTMENT_OTHER): Payer: Medicare Other | Admitting: Hematology & Oncology

## 2014-09-02 VITALS — BP 144/69 | HR 74 | Temp 98.3°F | Resp 18 | Wt 188.6 lb

## 2014-09-02 DIAGNOSIS — C189 Malignant neoplasm of colon, unspecified: Secondary | ICD-10-CM

## 2014-09-02 DIAGNOSIS — C61 Malignant neoplasm of prostate: Secondary | ICD-10-CM

## 2014-09-02 DIAGNOSIS — D72829 Elevated white blood cell count, unspecified: Secondary | ICD-10-CM

## 2014-09-02 DIAGNOSIS — Z85038 Personal history of other malignant neoplasm of large intestine: Secondary | ICD-10-CM

## 2014-09-02 NOTE — Progress Notes (Signed)
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 2. Prostate cancer status post radical prostatectomy in October 2004, low PSA velocity..  3. 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  4. Port-A-Cath in place and working well . 5. Pulmonary asbestosis.  DIAGNOSIS: No matching staging information was found for the patient.  SUMMARY OF ONCOLOGIC HISTORY:   Colon cancer   05/30/2011 Initial Diagnosis Colon cancer    CURRENT THERAPY: Observation  INTERVAL HISTORY: Roberto [name] 74 y.o. male returns for follow-up of his CRC and prostate cancer.  He has been advised by his urologist that he needs imaging studies for a rising PSA.  He underwent a prostatectomy in 2004 for prostate cancer. (details currerntly unknown)  He has questions about the scans, whether he can do them here, and what treatment options may be if the scans are positive or even negative.  He is due for another C-scope in July.  He would like a referral to Dr. Laural Golden as his Gastroenterologist has retired.    He denies change in his appetite or energy level.  He has no other major complaints today.    MEDICAL HISTORY: Past Medical History  Diagnosis Date  . Varicose veins of lower limb 1985    rt. leg  . 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 Prostate cancer     prostate 2004/surg  . Colon cancer     colon 2010/surg and chemo  . Port-a-cath in place 04/02/2013    has Colon cancer; Port-a-cath in place; Prostate cancer; and Leukocytosis on his problem list.     is allergic to tetanus toxoids.  Mr. Zwahlen does not currently have medications on file.  SURGICAL HISTORY: Past Surgical History  Procedure Laterality Date  . Colon  surgery    . Prostate surgery    . Styloid process excision    . Hand tendon surgery      lt. hand repair    SOCIAL HISTORY: History   Social History  . Marital Status: Married    Spouse Name: N/A    Number of Children: N/A  . Years of Education: N/A   Occupational History  . Not on file.   Social History Main Topics  . Smoking status: Current Every Day Smoker -- 1.00 packs/day for 48 years  . Smokeless tobacco: Never Used  . Alcohol Use: No  . Drug Use: No  . Sexual Activity: Not on file   Other Topics Concern  . Not on file   Social History Narrative    FAMILY HISTORY: Family History  Problem Relation Age of Onset  . Cancer Father     Review of Systems  Constitutional: Negative for fever, chills, weight loss and malaise/fatigue.  HENT: Negative for congestion, hearing loss, nosebleeds, sore throat and tinnitus.   Eyes: Negative for blurred vision, double vision, pain and discharge.  Respiratory: Negative for cough, hemoptysis, sputum production, shortness of breath and wheezing.   Cardiovascular: Negative for chest pain, palpitations, claudication, leg swelling and PND.  Gastrointestinal: Negative for heartburn, nausea, vomiting, abdominal pain, diarrhea, constipation, blood in stool and melena.  Genitourinary: Negative for dysuria, urgency, frequency and hematuria.  Musculoskeletal: Negative for myalgias, joint pain and falls.  Skin: Negative for itching and rash.  Neurological: Negative for dizziness, tingling, tremors, sensory change, speech change, focal weakness, seizures, loss of consciousness, weakness and headaches.  Endo/Heme/Allergies: Does not bruise/bleed easily.  Psychiatric/Behavioral: Negative for depression, suicidal ideas, memory loss and substance abuse. The patient is not nervous/anxious and does not have insomnia.     PHYSICAL EXAMINATION  ECOG PERFORMANCE STATUS: 0 - Asymptomatic  Filed Vitals:   09/02/14 0917  BP: 144/69  Pulse: 74    Temp: 98.3 F (36.8 C)  Resp: 18    Physical Exam  Constitutional: He is oriented to person, place, and time and well-developed, well-nourished, and in no distress.  HENT:  Head: Normocephalic and atraumatic.  Nose: Nose normal.  Mouth/Throat: Oropharynx is clear and moist. No oropharyngeal exudate.  Eyes: Conjunctivae and EOM are normal. Pupils are equal, round, and reactive to light. Right eye exhibits no discharge. Left eye exhibits no discharge. No scleral icterus.  Neck: Normal range of motion. Neck supple. No tracheal deviation present. No thyromegaly present.  Cardiovascular: Normal rate, regular rhythm and normal heart sounds.  Exam reveals no gallop and no friction rub.   No murmur heard. Pulmonary/Chest: Effort normal and breath sounds normal. He has no wheezes. He has no rales.  Abdominal: Soft. Bowel sounds are normal. He exhibits no distension and no mass. There is no tenderness. There is no rebound and no guarding.  Musculoskeletal: Normal range of motion. He exhibits no edema.  Lymphadenopathy:    He has no cervical adenopathy.  Neurological: He is alert and oriented to person, place, and time. He has normal reflexes. No cranial nerve deficit. Gait normal. Coordination normal.  Skin: Skin is warm and dry. No rash noted.  Psychiatric: Mood, memory, affect and judgment normal.  Nursing note and vitals reviewed.   LABORATORY DATA:  CBC    Component Value Date/Time   WBC 13.3* 08/31/2014 1321   RBC 4.99 08/31/2014 1321   HGB 14.5 08/31/2014 1321   HCT 45.6 08/31/2014 1321   PLT 306 08/31/2014 1321   MCV 91.4 08/31/2014 1321   MCH 29.1 08/31/2014 1321   MCHC 31.8 08/31/2014 1321   RDW 14.2 08/31/2014 1321   LYMPHSABS 3.6 08/31/2014 1321   MONOABS 1.1* 08/31/2014 1321   EOSABS 0.1 08/31/2014 1321   BASOSABS 0.0 08/31/2014 1321   CMP     Component Value Date/Time   NA 135 08/31/2014 1321   K 4.2 08/31/2014 1321   CL 103 08/31/2014 1321   CO2 26 08/31/2014  1321   GLUCOSE 83 08/31/2014 1321   BUN 18 08/31/2014 1321   CREATININE 1.19 08/31/2014 1321   CALCIUM 9.0 08/31/2014 1321   PROT 7.3 08/31/2014 1321   ALBUMIN 4.1 08/31/2014 1321   AST 17 08/31/2014 1321   ALT 14 08/31/2014 1321   ALKPHOS 87 08/31/2014 1321   BILITOT 0.5 08/31/2014 1321   GFRNONAA 60* 08/31/2014 1321   GFRAA 69* 08/31/2014 1321    PSA  Status: Finalresult Visible to patient:  Not Released Nextappt: 09/16/2014 at 08:30 AM in Oncology ([facility]) Dx:  Colon cancer           Ref Range 3d ago  36mo ago  59mo ago  41mo ago  16yr ago     PSA <=4.00 ng/mL 1.42 1.42CM 1.26CM 1.02CM 1.08CM   Comments: (NOTE)             ASSESSMENT and THERAPY PLAN:    Colon cancer 74 year old male with  a history of Stage III CRC s/p surgery on 02/22/2009 without obvious recurrence.  He is due for another screening C-scope. He has requested a referral to Dr. Laural Golden as he needs to establish with a new GI. We will arrange this for him today. In regards to his colon cancer he needs ongoing yearly observation with colonoscopy as recommended per his GI.   Leukocytosis He has had leukocytosis for sometime and I discussed obtaining a peripheral flow cytometry at his follow-up.  I explained to him the benefits of this test and why I wanted to send it.  He is agreeable.  We will add this to his return appointment.   Prostate cancer He has a history of prostate cancer dating back to 2004.  All the details (ie. Gleason score etc.) are currenty unknown.  However, his PSA is rising. We discussed that it is concerning and I agree with imaging as recommended by his urologist.  He would like to do his studies here at [facility].  We will order CT C/A/P and bone scan as requested.  We discussed that the most likely cause of his rise in PSA is recurrence within the prostate bed.  We briefly discussed salvage XRT. We also discussed the role of ADT in his situation and I advised him  that it is currently unknown.  We can address this further at his follow-up.  I will see him back after re-staging studies. He is to call in the interim with any problems or concerns.      All questions were answered. The patient knows to call the clinic with any problems, questions or concerns. We can certainly see the patient much sooner if necessary.  Molli Hazard 09/03/2014

## 2014-09-02 NOTE — Patient Instructions (Signed)
[place] at [facility]  Discharge Instructions:  Please call with any problems or concerns prior to follow-up I will see you back after your scans and we will discuss additional recommendations about your prostate cancer at that time _______________________________________________________________  Thank you for choosing [place] at [facility] to provide your oncology and hematology care.  To afford each patient quality time with our providers, please arrive at least 15 minutes before your scheduled appointment.  You need to re-schedule your appointment if you arrive 10 or more minutes late.  We strive to give you quality time with our providers, and arriving late affects you and other patients whose appointments are after yours.  Also, if you no show three or more times for appointments you may be dismissed from the clinic.  Again, thank you for choosing [place] at [facility] hope is that these requests will allow you access to exceptional care and in a timely manner. _______________________________________________________________  If you have questions after your visit, please contact our office at [phone] between the hours of 8:30 a.m. and 5:00 p.m. Voicemails left after 4:30 p.m. will not be returned until the following business day. _______________________________________________________________  For prescription refill requests, have your pharmacy contact our office. _______________________________________________________________  Recommendations made by the consultant and any test results will be sent to your referring physician. _______________________________________________________________

## 2014-09-03 DIAGNOSIS — D72829 Elevated white blood cell count, unspecified: Secondary | ICD-10-CM | POA: Insufficient documentation

## 2014-09-03 DIAGNOSIS — C61 Malignant neoplasm of prostate: Secondary | ICD-10-CM | POA: Insufficient documentation

## 2014-09-03 NOTE — Assessment & Plan Note (Signed)
He has had leukocytosis for sometime and I discussed obtaining a peripheral flow cytometry at his follow-up.  I explained to him the benefits of this test and why I wanted to send it.  He is agreeable.  We will add this to his return appointment.

## 2014-09-03 NOTE — Assessment & Plan Note (Signed)
74 year old male with a history of Stage III CRC s/p surgery on 02/22/2009 without obvious recurrence.  He is due for another screening C-scope. He has requested a referral to Dr. Laural Golden as he needs to establish with a new GI. We will arrange this for him today. In regards to his colon cancer he needs ongoing yearly observation with colonoscopy as recommended per his GI.

## 2014-09-03 NOTE — Assessment & Plan Note (Signed)
He has a history of prostate cancer dating back to 2004.  All the details (ie. Gleason score etc.) are currenty unknown.  However, his PSA is rising. We discussed that it is concerning and I agree with imaging as recommended by his urologist.  He would like to do his studies here at [facility].  We will order CT C/A/P and bone scan as requested.  We discussed that the most likely cause of his rise in PSA is recurrence within the prostate bed.  We briefly discussed salvage XRT. We also discussed the role of ADT in his situation and I advised him that it is currently unknown.  We can address this further at his follow-up.  I will see him back after re-staging studies. He is to call in the interim with any problems or concerns.

## 2014-09-10 ENCOUNTER — Other Ambulatory Visit (INDEPENDENT_AMBULATORY_CARE_PROVIDER_SITE_OTHER): Payer: Self-pay | Admitting: *Deleted

## 2014-09-10 DIAGNOSIS — Z85038 Personal history of other malignant neoplasm of large intestine: Secondary | ICD-10-CM

## 2014-09-16 ENCOUNTER — Encounter (HOSPITAL_COMMUNITY): Payer: Self-pay

## 2014-09-16 ENCOUNTER — Encounter (HOSPITAL_COMMUNITY)
Admission: RE | Admit: 2014-09-16 | Discharge: 2014-09-16 | Disposition: A | Discharge status: Home / Self Care | Payer: Medicare Other | Source: Ambulatory Visit | Attending: Hematology & Oncology | Admitting: Hematology & Oncology

## 2014-09-16 ENCOUNTER — Ambulatory Visit (HOSPITAL_COMMUNITY)
Admission: RE | Admit: 2014-09-16 | Discharge: 2014-09-16 | Disposition: A | Discharge status: Home / Self Care | Payer: Medicare Other | Source: Ambulatory Visit | Attending: Hematology & Oncology | Admitting: Hematology & Oncology

## 2014-09-16 ENCOUNTER — Other Ambulatory Visit (HOSPITAL_COMMUNITY): Payer: Medicare Other

## 2014-09-16 ENCOUNTER — Encounter (HOSPITAL_COMMUNITY): Payer: Medicare Other | Attending: Hematology and Oncology

## 2014-09-16 DIAGNOSIS — Z9889 Other specified postprocedural states: Secondary | ICD-10-CM | POA: Diagnosis not present

## 2014-09-16 DIAGNOSIS — C189 Malignant neoplasm of colon, unspecified: Secondary | ICD-10-CM | POA: Diagnosis present

## 2014-09-16 DIAGNOSIS — Z85038 Personal history of other malignant neoplasm of large intestine: Secondary | ICD-10-CM | POA: Insufficient documentation

## 2014-09-16 DIAGNOSIS — R972 Elevated prostate specific antigen [PSA]: Secondary | ICD-10-CM | POA: Diagnosis not present

## 2014-09-16 DIAGNOSIS — Z9221 Personal history of antineoplastic chemotherapy: Secondary | ICD-10-CM | POA: Diagnosis not present

## 2014-09-16 DIAGNOSIS — C61 Malignant neoplasm of prostate: Secondary | ICD-10-CM | POA: Insufficient documentation

## 2014-09-16 DIAGNOSIS — D72829 Elevated white blood cell count, unspecified: Secondary | ICD-10-CM

## 2014-09-16 MED ORDER — TECHNETIUM TC 99M MEDRONATE IV KIT
25.0000 | PACK | Freq: Once | INTRAVENOUS | Status: AC | PRN
  Administered 2014-09-16: 25 via INTRAVENOUS

## 2014-09-16 MED ORDER — IOHEXOL 300 MG/ML  SOLN
100.0000 mL | Freq: Once | INTRAMUSCULAR | Status: AC | PRN
  Administered 2014-09-16: 100 mL via INTRAVENOUS

## 2014-09-16 NOTE — Progress Notes (Signed)
LABS DRAWN

## 2014-09-18 ENCOUNTER — Encounter (HOSPITAL_BASED_OUTPATIENT_CLINIC_OR_DEPARTMENT_OTHER): Payer: Medicare Other | Admitting: Hematology & Oncology

## 2014-09-18 ENCOUNTER — Encounter (HOSPITAL_COMMUNITY): Payer: Self-pay | Admitting: Hematology & Oncology

## 2014-09-18 ENCOUNTER — Ambulatory Visit (HOSPITAL_COMMUNITY): Payer: Medicare Other | Admitting: Hematology & Oncology

## 2014-09-18 VITALS — BP 160/68 | HR 79 | Temp 98.3°F | Resp 20 | Wt 190.8 lb

## 2014-09-18 DIAGNOSIS — Z85038 Personal history of other malignant neoplasm of large intestine: Secondary | ICD-10-CM

## 2014-09-18 DIAGNOSIS — Z720 Tobacco use: Secondary | ICD-10-CM

## 2014-09-18 DIAGNOSIS — C189 Malignant neoplasm of colon, unspecified: Secondary | ICD-10-CM

## 2014-09-18 DIAGNOSIS — C61 Malignant neoplasm of prostate: Secondary | ICD-10-CM

## 2014-09-18 DIAGNOSIS — R970 Elevated carcinoembryonic antigen [CEA]: Secondary | ICD-10-CM

## 2014-09-18 NOTE — Patient Instructions (Signed)
[place] at [facility] Discharge Instructions  RECOMMENDATIONS MADE BY THE CONSULTANT AND ANY TEST RESULTS WILL BE SENT TO YOUR REFERRING PHYSICIAN.  Test results reviewed by Dr. Whitney Muse.  Elevation in CEA may be related to your smoking.  We will recheck in April when you get your port flushed. Report changes in bowel habits, blood in your stool or other concerns.  Port flush every 6 weeks, office visit in 6 months.  Thank you for choosing [place] at [facility] to provide your oncology and hematology care.  To afford each patient quality time with our provider, please arrive at least 15 minutes before your scheduled appointment time.    You need to re-schedule your appointment should you arrive 10 or more minutes late.  We strive to give you quality time with our providers, and arriving late affects you and other patients whose appointments are after yours.  Also, if you no show three or more times for appointments you may be dismissed from the clinic at the providers discretion.     Again, thank you for choosing [facility].  Our hope is that these requests will decrease the amount of time that you wait before being seen by our physicians.       _____________________________________________________________  Should you have questions after your visit to [facility], please contact our office at [phone] between the hours of 8:30 a.m. and 4:30 p.m.  Voicemails left after 4:30 p.m. will not be returned until the following business day.  For prescription refill requests, have your pharmacy contact our office.

## 2014-09-18 NOTE — Progress Notes (Signed)
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 2. Prostate cancer status post radical prostatectomy in October 2004, low PSA velocity..  3. 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  4. Port-A-Cath in place and working well . 5. Pulmonary asbestosis.  DIAGNOSIS: No matching staging information was found for the patient.  SUMMARY OF ONCOLOGIC HISTORY:   Colon cancer   05/30/2011 Initial Diagnosis Colon cancer    CURRENT THERAPY: Observation  INTERVAL HISTORY: ANTWINE AGOSTO 74 y.o. male returns for follow-up of his CRC and prostate cancer.  He is here to review his recent imaging studies. He has had a biochemical recurrence of the previously diagnosed prostate cancer. He underwent prostatectomy back in 2006. He has a very slow doubling time.  CEA came back elevated at his last visit but he does continue to smoke. We have discussed smoking cessation several times at each of our last visit. He has no other major complaints today.  MEDICAL HISTORY: Past Medical History  Diagnosis Date  . Varicose veins of lower limb 1985    rt. leg  . 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 Prostate cancer     prostate 2004/surg  . Colon cancer     colon 2010/surg and chemo  . Port-a-cath in place 04/02/2013    has Colon cancer; Port-a-cath in place; Prostate cancer; and Leukocytosis on his problem list.     is allergic to tetanus toxoids.  Mr. Diemer does not currently have medications on file.  SURGICAL HISTORY: Past Surgical History  Procedure Laterality Date  . Colon surgery    . Prostate surgery    . Styloid process excision    . Hand tendon surgery      lt. hand repair    SOCIAL HISTORY: History    Social History  . Marital Status: Married    Spouse Name: N/A  . Number of Children: N/A  . Years of Education: N/A   Occupational History  . Not on file.   Social History Main Topics  . Smoking status: Current Every Day Smoker -- 1.00 packs/day for 48 years  . Smokeless tobacco: Never Used  . Alcohol Use: No  . Drug Use: No  . Sexual Activity: Not on file   Other Topics Concern  . Not on file   Social History Narrative    FAMILY HISTORY: Family History  Problem Relation Age of Onset  . Cancer Father     Review of Systems  Constitutional: Negative for fever, chills, weight loss and malaise/fatigue.  HENT: Negative for congestion, hearing loss, nosebleeds, sore throat and tinnitus.   Eyes: Negative for blurred vision, double vision, pain and discharge.  Respiratory: Negative for cough, hemoptysis, sputum production, shortness of breath and wheezing.   Cardiovascular: Negative for chest pain, palpitations, claudication, leg swelling and PND.  Gastrointestinal: Negative for heartburn, nausea, vomiting, abdominal pain, diarrhea, constipation, blood in stool and melena.  Genitourinary: Negative for dysuria, urgency, frequency and hematuria.  Musculoskeletal: Negative for myalgias, joint pain and falls.  Skin: Negative for itching and rash.  Neurological: Negative for dizziness, tingling, tremors, sensory change, speech change, focal weakness, seizures, loss of consciousness, weakness and headaches.  Endo/Heme/Allergies: Does not bruise/bleed easily.  Psychiatric/Behavioral: Negative for depression, suicidal ideas, memory loss and substance abuse. The patient is not nervous/anxious and does not have insomnia.     PHYSICAL EXAMINATION  ECOG PERFORMANCE STATUS: 0 - Asymptomatic  Filed Vitals:   09/18/14 1134  BP: 160/68  Pulse: 79  Temp: 98.3 F (36.8 C)  Resp: 20    Physical Exam  Constitutional: He is oriented to person, place, and time and well-developed,  well-nourished, and in no distress.  HENT:  Head: Normocephalic and atraumatic.  Nose: Nose normal.  Mouth/Throat: Oropharynx is clear and moist. No oropharyngeal exudate.  Eyes: Conjunctivae and EOM are normal. Pupils are equal, round, and reactive to light. Right eye exhibits no discharge. Left eye exhibits no discharge. No scleral icterus.  Neck: Normal range of motion. Neck supple. No tracheal deviation present. No thyromegaly present.  Cardiovascular: Normal rate, regular rhythm and normal heart sounds.  Exam reveals no gallop and no friction rub.   No murmur heard. Pulmonary/Chest: Effort normal and breath sounds normal. He has no wheezes. He has no rales.  Abdominal: Soft. Bowel sounds are normal. He exhibits no distension and no mass. There is no tenderness. There is no rebound and no guarding.  Musculoskeletal: Normal range of motion. He exhibits no edema.  Lymphadenopathy:    He has no cervical adenopathy.  Neurological: He is alert and oriented to person, place, and time. He has normal reflexes. No cranial nerve deficit. Gait normal. Coordination normal.  Skin: Skin is warm and dry. No rash noted.  Psychiatric: Mood, memory, affect and judgment normal.  Nursing note and vitals reviewed.   LABORATORY DATA:  CBC    Component Value Date/Time   WBC 13.3* 08/31/2014 1321   RBC 4.99 08/31/2014 1321   HGB 14.5 08/31/2014 1321   HCT 45.6 08/31/2014 1321   PLT 306 08/31/2014 1321   MCV 91.4 08/31/2014 1321   MCH 29.1 08/31/2014 1321   MCHC 31.8 08/31/2014 1321   RDW 14.2 08/31/2014 1321   LYMPHSABS 3.6 08/31/2014 1321   MONOABS 1.1* 08/31/2014 1321   EOSABS 0.1 08/31/2014 1321   BASOSABS 0.0 08/31/2014 1321   CMP     Component Value Date/Time   NA 135 08/31/2014 1321   K 4.2 08/31/2014 1321   CL 103 08/31/2014 1321   CO2 26 08/31/2014 1321   GLUCOSE 83 08/31/2014 1321   BUN 18 08/31/2014 1321   CREATININE 1.19 08/31/2014 1321   CALCIUM 9.0 08/31/2014 1321   PROT  7.3 08/31/2014 1321   ALBUMIN 4.1 08/31/2014 1321   AST 17 08/31/2014 1321   ALT 14 08/31/2014 1321   ALKPHOS 87 08/31/2014 1321   BILITOT 0.5 08/31/2014 1321   GFRNONAA 60* 08/31/2014 1321   GFRAA 69* 08/31/2014 1321    PSA  Status: Finalresult Visible to patient:  Not Released Nextappt: 09/16/2014 at 08:30 AM in Oncology ([facility]) Dx:  Colon cancer           Ref Range 3d ago  41mo ago  71mo ago  32mo ago  60yr ago     PSA <=4.00 ng/mL 1.42 1.42CM 1.26CM 1.02CM 1.08CM   Comments: (NOTE)         RADIOLOGY:  CLINICAL DATA: Colon cancer diagnosed in 2010 post surgery and chemotherapy. Remote history of prostate cancer with surgery. Rising serum PSA levels. Initial encounter.  EXAM: 09/16/2014 CT CHEST, ABDOMEN, AND PELVIS WITH CONTRAST IMPRESSION: 1. No evidence of metastatic disease from the patient's colon or prostate cancer. 2. Stable  atherosclerosis. 3. Stable bilateral adrenal hyperplasia.   Electronically Signed  By: Richardean Sale M.D.  On: 09/16/2014 15:29  CLINICAL DATA: Prostate cancer. Rising PSA. History of colon cancer.  EXAM: 09/16/2014 NUCLEAR MEDICINE WHOLE BODY BONE SCAN IMPRESSION: No findings suggestive of metastatic disease to the skeleton.   Electronically Signed  By: Lorriane Shire M.D.  On: 09/16/2014 12:43  ASSESSMENT and THERAPY PLAN:    Prostate cancer Present 74 year old male with a history of prostate cancer dating back to 2016. He underwent a radical prostatectomy. I do not have all the details in regards to his initial pathology. He has evidence of a biochemical recurrence with a slow doubling time. He has undergone CT imaging and bone scan which showed no evidence of metastatic disease. We discussed multiple options for managing a biochemical recurrence. He would like to talk to a radiation oncologist and has chosen to see one in [place]. We will get copies of his imaging studies on a disc  for him to take with him. I advised him if he has any further questions or concerns to let us know.   Colon cancer He has a history of colon cancer dating back to 2012. CEA at his last visit came back mildly elevated. He does smoke and imaging studies are unremarkable. I have recommended repeating his CEA at his next port flush. I would like to see him back in 6 months. If his CEA continues to rise we will have to consider additional imaging modalities. He is due for colonoscopy this summer and we have referred him to Dr. Laural Golden.     All questions were answered. The patient knows to call the clinic with any problems, questions or concerns. We can certainly see the patient much sooner if necessary.  Molli Hazard 09/20/2014

## 2014-09-20 NOTE — Assessment & Plan Note (Signed)
Present 74 year old male with a history of prostate cancer dating back to 2016. He underwent a radical prostatectomy. I do not have all the details in regards to his initial pathology. He has evidence of a biochemical recurrence with a slow doubling time. He has undergone CT imaging and bone scan which showed no evidence of metastatic disease. We discussed multiple options for managing a biochemical recurrence. He would like to talk to a radiation oncologist and has chosen to see one in [place]. We will get copies of his imaging studies on a disc for him to take with him. I advised him if he has any further questions or concerns to let us know.

## 2014-09-20 NOTE — Assessment & Plan Note (Addendum)
He has a history of colon cancer dating back to 2012. CEA at his last visit came back mildly elevated. He does smoke and imaging studies are unremarkable. I have recommended repeating his CEA at his next port flush. I would like to see him back in 6 months. If his CEA continues to rise we will have to consider additional imaging modalities. He is due for colonoscopy this summer and we have referred him to Dr. Laural Golden.

## 2014-10-05 ENCOUNTER — Telehealth (INDEPENDENT_AMBULATORY_CARE_PROVIDER_SITE_OTHER): Payer: Self-pay | Admitting: *Deleted

## 2014-10-05 DIAGNOSIS — Z1211 Encounter for screening for malignant neoplasm of colon: Secondary | ICD-10-CM

## 2014-10-05 NOTE — Telephone Encounter (Signed)
Patient needs movi prep 

## 2014-10-06 MED ORDER — PEG-KCL-NACL-NASULF-NA ASC-C 100 G PO SOLR: 1.0000 | Freq: Once | ORAL | Status: DC

## 2014-10-12 ENCOUNTER — Encounter (HOSPITAL_COMMUNITY): Payer: Self-pay

## 2014-10-12 ENCOUNTER — Encounter (HOSPITAL_COMMUNITY): Payer: Medicare Other | Attending: Hematology and Oncology

## 2014-10-12 VITALS — BP 162/85 | HR 98 | Temp 98.1°F | Resp 20

## 2014-10-12 DIAGNOSIS — Z9889 Other specified postprocedural states: Secondary | ICD-10-CM | POA: Insufficient documentation

## 2014-10-12 DIAGNOSIS — Z85038 Personal history of other malignant neoplasm of large intestine: Secondary | ICD-10-CM

## 2014-10-12 DIAGNOSIS — Z452 Encounter for adjustment and management of vascular access device: Secondary | ICD-10-CM

## 2014-10-12 DIAGNOSIS — C189 Malignant neoplasm of colon, unspecified: Secondary | ICD-10-CM

## 2014-10-12 DIAGNOSIS — C61 Malignant neoplasm of prostate: Secondary | ICD-10-CM | POA: Insufficient documentation

## 2014-10-12 MED ORDER — SODIUM CHLORIDE 0.9 % IJ SOLN: 10.0000 mL | INTRAMUSCULAR | Status: DC | PRN

## 2014-10-12 MED ORDER — HEPARIN SOD (PORK) LOCK FLUSH 100 UNIT/ML IV SOLN
500.0000 [IU] | Freq: Once | INTRAVENOUS | Status: AC
  Administered 2014-10-12: 500 [IU] via INTRAVENOUS
  Filled 2014-10-12: qty 5

## 2014-10-12 NOTE — Progress Notes (Signed)
Roberto [name] presented for Portacath access and flush. Portacath located left chest wall accessed with  H 20 needle. Good blood return present. Portacath flushed with 33ml NS and 500U/24ml Heparin and needle removed intact. Procedure without incident. Patient tolerated procedure well.

## 2014-10-12 NOTE — Patient Instructions (Signed)
[place] at [facility] Discharge Instructions  RECOMMENDATIONS MADE BY THE CONSULTANT AND ANY TEST RESULTS WILL BE SENT TO YOUR REFERRING PHYSICIAN.  We flushed your port today. Call for any questions or concerns. We will see again for your next port flush.  Thank you for choosing [place] at [facility] to provide your oncology and hematology care.  To afford each patient quality time with our provider, please arrive at least 15 minutes before your scheduled appointment time.    You need to re-schedule your appointment should you arrive 10 or more minutes late.  We strive to give you quality time with our providers, and arriving late affects you and other patients whose appointments are after yours.  Also, if you no show three or more times for appointments you may be dismissed from the clinic at the providers discretion.     Again, thank you for choosing [facility].  Our hope is that these requests will decrease the amount of time that you wait before being seen by our physicians.       _____________________________________________________________  Should you have questions after your visit to [facility], please contact our office at [phone] between the hours of 8:30 a.m. and 4:30 p.m.  Voicemails left after 4:30 p.m. will not be returned until the following business day.  For prescription refill requests, have your pharmacy contact our office.

## 2014-10-16 ENCOUNTER — Telehealth (INDEPENDENT_AMBULATORY_CARE_PROVIDER_SITE_OTHER): Payer: Self-pay | Admitting: *Deleted

## 2014-10-16 NOTE — Telephone Encounter (Signed)
Referring MD: [facility] PCP: [name]   Procedure: tcs  Reason/Indication:  Hx colon ca  Has patient had this procedure before?  Yes, 2 yrs ago, Dr Algis Greenhouse  If so, when, by whom and where?    Is there a family history of colon cancer?  no  Who?  What age when diagnosed?    Is patient diabetic?   no      Does patient have prosthetic heart valve?  no  Do you have a pacemaker?  no  Has patient ever had endocarditis? no  Has patient had joint replacement within last 12 months?  no  Does patient tend to be constipated or take laxatives? no  Is patient on Coumadin, Plavix and/or Aspirin? yes  Medications: asa 81 mg daily, lovastatin 20 mg daily, diclofenac 50 mg daily  Allergies: see epic  Medication Adjustment: asa 2 days  Procedure date & time: 11/11/14 at 830

## 2014-10-20 NOTE — Telephone Encounter (Signed)
agree

## 2014-11-11 ENCOUNTER — Encounter (HOSPITAL_COMMUNITY): Payer: Self-pay | Admitting: *Deleted

## 2014-11-11 ENCOUNTER — Ambulatory Visit (HOSPITAL_COMMUNITY)
Admission: RE | Admit: 2014-11-11 | Discharge: 2014-11-11 | Disposition: A | Discharge status: Home / Self Care | Payer: Medicare Other | Source: Ambulatory Visit | Attending: Internal Medicine | Admitting: Internal Medicine

## 2014-11-11 ENCOUNTER — Encounter (HOSPITAL_COMMUNITY)
Admission: RE | Disposition: A | Discharge status: Home / Self Care | Payer: Self-pay | Source: Ambulatory Visit | Attending: Internal Medicine

## 2014-11-11 DIAGNOSIS — K5730 Diverticulosis of large intestine without perforation or abscess without bleeding: Secondary | ICD-10-CM

## 2014-11-11 DIAGNOSIS — Z791 Long term (current) use of non-steroidal anti-inflammatories (NSAID): Secondary | ICD-10-CM | POA: Diagnosis not present

## 2014-11-11 DIAGNOSIS — Z79899 Other long term (current) drug therapy: Secondary | ICD-10-CM | POA: Insufficient documentation

## 2014-11-11 DIAGNOSIS — Z85048 Personal history of other malignant neoplasm of rectum, rectosigmoid junction, and anus: Secondary | ICD-10-CM | POA: Diagnosis not present

## 2014-11-11 DIAGNOSIS — Z8546 Personal history of malignant neoplasm of prostate: Secondary | ICD-10-CM | POA: Insufficient documentation

## 2014-11-11 DIAGNOSIS — R970 Elevated carcinoembryonic antigen [CEA]: Secondary | ICD-10-CM | POA: Diagnosis not present

## 2014-11-11 DIAGNOSIS — Z85038 Personal history of other malignant neoplasm of large intestine: Secondary | ICD-10-CM | POA: Insufficient documentation

## 2014-11-11 DIAGNOSIS — Z08 Encounter for follow-up examination after completed treatment for malignant neoplasm: Secondary | ICD-10-CM | POA: Diagnosis present

## 2014-11-11 DIAGNOSIS — Z7982 Long term (current) use of aspirin: Secondary | ICD-10-CM | POA: Insufficient documentation

## 2014-11-11 DIAGNOSIS — E780 Pure hypercholesterolemia: Secondary | ICD-10-CM | POA: Diagnosis not present

## 2014-11-11 DIAGNOSIS — Z9049 Acquired absence of other specified parts of digestive tract: Secondary | ICD-10-CM | POA: Insufficient documentation

## 2014-11-11 DIAGNOSIS — F17210 Nicotine dependence, cigarettes, uncomplicated: Secondary | ICD-10-CM | POA: Diagnosis not present

## 2014-11-11 DIAGNOSIS — Z9221 Personal history of antineoplastic chemotherapy: Secondary | ICD-10-CM | POA: Diagnosis not present

## 2014-11-11 DIAGNOSIS — D128 Benign neoplasm of rectum: Secondary | ICD-10-CM | POA: Diagnosis not present

## 2014-11-11 DIAGNOSIS — K621 Rectal polyp: Secondary | ICD-10-CM | POA: Diagnosis not present

## 2014-11-11 HISTORY — PX: COLONOSCOPY: SHX5424

## 2014-11-11 HISTORY — DX: Pure hypercholesterolemia, unspecified: E78.00

## 2014-11-11 SURGERY — COLONOSCOPY
Anesthesia: Moderate Sedation

## 2014-11-11 MED ORDER — MIDAZOLAM HCL 5 MG/5ML IJ SOLN
INTRAMUSCULAR | Status: AC
  Filled 2014-11-11: qty 10

## 2014-11-11 MED ORDER — MEPERIDINE HCL 50 MG/ML IJ SOLN
INTRAMUSCULAR | Status: AC
  Filled 2014-11-11: qty 1

## 2014-11-11 MED ORDER — MIDAZOLAM HCL 5 MG/5ML IJ SOLN
INTRAMUSCULAR | Status: DC | PRN
  Administered 2014-11-11: 1 mg via INTRAVENOUS
  Administered 2014-11-11: 2 mg via INTRAVENOUS
  Administered 2014-11-11: 1 mg via INTRAVENOUS

## 2014-11-11 MED ORDER — MEPERIDINE HCL 50 MG/ML IJ SOLN
INTRAMUSCULAR | Status: DC | PRN
  Administered 2014-11-11 (×2): 25 mg via INTRAVENOUS

## 2014-11-11 MED ORDER — STERILE WATER FOR IRRIGATION IR SOLN
Status: DC | PRN
  Administered 2014-11-11: 08:00:00

## 2014-11-11 MED ORDER — SODIUM CHLORIDE 0.9 % IV SOLN
INTRAVENOUS | Status: DC
  Administered 2014-11-11: 08:00:00 via INTRAVENOUS

## 2014-11-11 NOTE — H&P (Signed)
Roberto [name] is an 74 y.o. male.   Chief Complaint: Patient is here for colonoscopy. HPI: Patient is 74 year old Caucasian male with history of colon carcinoma. He underwent surgery followed by chemotherapy in 2010. He has remained in remission. CEA January this year was mildly elevated at 5.7. Chest and abdominopelvic CT in February 2016 were unremarkable.  Last colonoscopy was in June 2014. Patient denies abdominal pain change in bowel habits or rectal bleeding. Family history is negative for CRC.  Past Medical History  Diagnosis Date  . Varicose veins of lower limb 1985    rt. leg  . 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 Prostate cancer     prostate 2004/surg  . Colon cancer     colon 2010/surg and chemo  . Port-a-cath in place 04/02/2013  . Hypercholesteremia     Past Surgical History  Procedure Laterality Date  . Colon surgery    . Prostate surgery    . Styloid process excision    . Hand tendon surgery      lt. hand repair  . Varicose vein surgery  1980's    Family History  Problem Relation Age of Onset  . Cancer Father    Social History:  reports that he has been smoking Cigarettes.  He has a 51 pack-year smoking history. He has never used smokeless tobacco. He reports that he does not drink alcohol or use illicit drugs.  Allergies:  Allergies  Allergen Reactions  . Tetanus Toxoids Swelling    Arm became very swollen after injection (occurred approx. 40 years ago.)    Medications Prior to Admission  Medication Sig Dispense Refill  . acetaminophen (TYLENOL) 325 MG tablet Take 650 mg by mouth every 6 (six) hours as needed.    [name] aspirin 81 MG tablet Take 81 mg by mouth daily.      . diclofenac (CATAFLAM) 50 MG tablet Take 50 mg by mouth 1 day or 1 dose.    . lidocaine-prilocaine (EMLA) cream Apply a quarter size amount to port site 1 hour prior to appointment. Do not rub in. Cover with plastic wrap. 30 g 5  . lovastatin (MEVACOR) 20 MG tablet Take 20 mg  by mouth daily.    . peg 3350 powder (MOVIPREP) 100 G SOLR Take 1 kit (200 g total) by mouth once. 1 kit 0    No results found for this or any previous visit (from the past 48 hour(s)). No results found.  ROS  Blood pressure 162/88, pulse 97, temperature 97.8 F (36.6 C), temperature source Oral, resp. rate 20, height 5' 10.5" (1.791 m), weight 192 lb (87.091 kg), SpO2 96 %. Physical Exam  Constitutional: He appears well-developed and well-nourished.  HENT:  Mouth/Throat: Oropharynx is clear and moist.  Eyes: Conjunctivae are normal.  Neck: No thyromegaly present.  Cardiovascular: Normal rate, regular rhythm and normal heart sounds.   No murmur heard. Respiratory: Effort normal and breath sounds normal.  GI: Soft. He exhibits no distension and no mass. There is no tenderness.  Right paramedian and lower midline scar  Musculoskeletal: He exhibits no edema.  Lymphadenopathy:    He has no cervical adenopathy.  Neurological: He is alert.  Skin: Skin is warm and dry.     Assessment/Plan History of colon carcinoma. Mildly elevated CEA possibly due to cigarette smoking. Surveillance colonoscopy  Theressa Piedra U 11/11/2014, 8:22 AM

## 2014-11-11 NOTE — Op Note (Signed)
COLONOSCOPY PROCEDURE REPORT  PATIENT:  Roberto [name]  MR#:  [id number] Birthdate:  05/19/1941, 74 y.o., male Endoscopist:  Dr. Rogene Houston, MD Referred By:  Dr. Molli Hazard, M.D Procedure Date: 11/11/2014  Procedure:   Colonoscopy  Indications: Patient is 74 year old Caucasian male was history of stage IIIB ascending colon carcinoma status post right hemicolectomy in July 2010 followed by chemotherapy who is here for surveillance colonoscopy. His last exam was in June 2014. He has no GI symptoms. Recent CEA was mildly elevated possibly secondary to cigarette smoking. Personal history significant for prostate carcinoma. Family history is negative for CRC.  Informed Consent:  The procedure and risks were reviewed with the patient and informed consent was obtained.  Medications:  Demerol 50 mg IV Versed 4 mg IV  Description of procedure:  After a digital rectal exam was performed, that colonoscope was advanced from the anus through the rectum and colon to the area of of hepatic flexure where ileocolonic anastomosis was identified. From the level of anastomosis scope was slowly and cautiously withdrawn. The mucosal surfaces were carefully surveyed utilizing scope tip to flexion to facilitate fold flattening as needed. The scope was pulled down into the rectum where a thorough exam including retroflexion was performed.  Findings:  Prep excellent. Wide-open ileocolonic anastomosis. Normal mucosa of the transverse and descending colon. Few small diverticula at sigmoid colon. Small polyp cold snared from rectum. Unremarkable anorectal junction.   Therapeutic/Diagnostic Maneuvers Performed:  See above  Complications:  None  Colon  Withdrawal Time:  16 minutes  Impression:  Normal-appearing ileocolonic anastomosis. Mild sigmoid colon diverticulosis. Small rectal polyp was cold snared.   Recommendations:  Standard instructions given. I will contact patient with  biopsy results and further recommendations. Next colonoscopy in 3 years.  REHMAN,NAJEEB U  11/11/2014 9:04 AM  CC: Dr. [name], MD & Dr. Rayne Du ref. provider found  CC:Dr. Molli Hazard, MD

## 2014-11-11 NOTE — Discharge Instructions (Signed)
Resume usual medications and high fiber diet No driving for 24 hours. Patient will call with biopsy results. Next colonoscopy in 3 years. Colon Polyps Polyps are lumps of extra tissue growing inside the body. Polyps can grow in the large intestine (colon). Most colon polyps are noncancerous (benign). However, some colon polyps can become cancerous over time. Polyps that are larger than a pea may be harmful. To be safe, caregivers remove and test all polyps. CAUSES  Polyps form when mutations in the genes cause your cells to grow and divide even though no more tissue is needed. RISK FACTORS There are a number of risk factors that can increase your chances of getting colon polyps. They include:  Being older than 50 years.  Family history of colon polyps or colon cancer.  Long-term colon diseases, such as colitis or Crohn disease.  Being overweight.  Smoking.  Being inactive.  Drinking too much alcohol. SYMPTOMS  Most small polyps do not cause symptoms. If symptoms are present, they may include:  Blood in the stool. The stool may look dark red or black.  Constipation or diarrhea that lasts longer than 1 week. DIAGNOSIS People often do not know they have polyps until their caregiver finds them during a regular checkup. Your caregiver can use 4 tests to check for polyps:  Digital rectal exam. The caregiver wears gloves and feels inside the rectum. This test would find polyps only in the rectum.  Barium enema. The caregiver puts a liquid called barium into your rectum before taking X-rays of your colon. Barium makes your colon look white. Polyps are dark, so they are easy to see in the X-ray pictures.  Sigmoidoscopy. A thin, flexible tube (sigmoidoscope) is placed into your rectum. The sigmoidoscope has a light and tiny camera in it. The caregiver uses the sigmoidoscope to look at the last third of your colon.  Colonoscopy. This test is like sigmoidoscopy, but the caregiver looks at  the entire colon. This is the most common method for finding and removing polyps. TREATMENT  Any polyps will be removed during a sigmoidoscopy or colonoscopy. The polyps are then tested for cancer. PREVENTION  To help lower your risk of getting more colon polyps:  Eat plenty of fruits and vegetables. Avoid eating fatty foods.  Do not smoke.  Avoid drinking alcohol.  Exercise every day.  Lose weight if recommended by your caregiver.  Eat plenty of calcium and folate. Foods that are rich in calcium include milk, cheese, and broccoli. Foods that are rich in folate include chickpeas, kidney beans, and spinach. HOME CARE INSTRUCTIONS Keep all follow-up appointments as directed by your caregiver. You may need periodic exams to check for polyps. SEEK MEDICAL CARE IF: You notice bleeding during a bowel movement. Document Released: 04/19/2004 Document Revised: 10/16/2011 Document Reviewed: 10/03/2011 [facility] Patient Information 2015 [place], [place]. This information is not intended to replace advice given to you by your health care provider. Make sure you discuss any questions you have with your health care provider. Colonoscopy, Care After Refer to this sheet in the next few weeks. These instructions provide you with information on caring for yourself after your procedure. Your health care provider may also give you more specific instructions. Your treatment has been planned according to current medical practices, but problems sometimes occur. Call your health care provider if you have any problems or questions after your procedure. WHAT TO EXPECT AFTER THE PROCEDURE  After your procedure, it is typical to have the following:  A  small amount of blood in your stool.  Moderate amounts of gas and mild abdominal cramping or bloating. HOME CARE INSTRUCTIONS  Do not drive, operate machinery, or sign important documents for 24 hours.  You may shower and resume your regular physical activities,  but move at a slower pace for the first 24 hours.  Take frequent rest periods for the first 24 hours.  Walk around or put a warm pack on your abdomen to help reduce abdominal cramping and bloating.  Drink enough fluids to keep your urine clear or pale yellow.  You may resume your normal diet as instructed by your health care provider. Avoid heavy or fried foods that are hard to digest.  Avoid drinking alcohol for 24 hours or as instructed by your health care provider.  Only take over-the-counter or prescription medicines as directed by your health care provider.  If a tissue sample (biopsy) was taken during your procedure:  Do not take aspirin or blood thinners for 7 days, or as instructed by your health care provider.  Do not drink alcohol for 7 days, or as instructed by your health care provider.  Eat soft foods for the first 24 hours. SEEK MEDICAL CARE IF: You have persistent spotting of blood in your stool 2-3 days after the procedure. SEEK IMMEDIATE MEDICAL CARE IF:  You have more than a small spotting of blood in your stool.  You pass large blood clots in your stool.  Your abdomen is swollen (distended).  You have nausea or vomiting.  You have a fever.  You have increasing abdominal pain that is not relieved with medicine. Document Released: 03/07/2004 Document Revised: 05/14/2013 Document Reviewed: 03/31/2013 [facility] Patient Information 2015 [place], [place]. This information is not intended to replace advice given to you by your health care provider. Make sure you discuss any questions you have with your health care provider.

## 2014-11-12 ENCOUNTER — Encounter (HOSPITAL_COMMUNITY): Payer: Self-pay | Admitting: Internal Medicine

## 2014-11-20 ENCOUNTER — Encounter (HOSPITAL_COMMUNITY): Payer: Self-pay

## 2014-11-20 ENCOUNTER — Encounter (HOSPITAL_COMMUNITY): Payer: Medicare Other | Attending: Hematology & Oncology

## 2014-11-20 DIAGNOSIS — Z85038 Personal history of other malignant neoplasm of large intestine: Secondary | ICD-10-CM | POA: Diagnosis present

## 2014-11-20 DIAGNOSIS — C189 Malignant neoplasm of colon, unspecified: Secondary | ICD-10-CM | POA: Diagnosis not present

## 2014-11-20 MED ORDER — HEPARIN SOD (PORK) LOCK FLUSH 100 UNIT/ML IV SOLN
500.0000 [IU] | Freq: Once | INTRAVENOUS | Status: AC
Start: 2014-11-20 — End: 2014-11-20
  Administered 2014-11-20: 500 [IU] via INTRAVENOUS

## 2014-11-20 MED ORDER — SODIUM CHLORIDE 0.9 % IJ SOLN
10.0000 mL | INTRAMUSCULAR | Status: DC | PRN
  Administered 2014-11-20: 10 mL via INTRAVENOUS
  Filled 2014-11-20: qty 10

## 2014-11-20 NOTE — Patient Instructions (Signed)
[place] at [facility]  Discharge Instructions:  You had your port flushed Please follow up as scheduled  Call the clinic if you have any questions or concerns  _______________________________________________________________  Thank you for choosing [place] at [facility] to provide your oncology and hematology care.  To afford each patient quality time with our providers, please arrive at least 15 minutes before your scheduled appointment.  You need to re-schedule your appointment if you arrive 10 or more minutes late.  We strive to give you quality time with our providers, and arriving late affects you and other patients whose appointments are after yours.  Also, if you no show three or more times for appointments you may be dismissed from the clinic.  Again, thank you for choosing [place] at [place] hope is that these requests will allow you access to exceptional care and in a timely manner. _______________________________________________________________  If you have questions after your visit, please contact our office at [phone] between the hours of 8:30 a.m. and 5:00 p.m. Voicemails left after 4:30 p.m. will not be returned until the following business day. _______________________________________________________________  For prescription refill requests, have your pharmacy contact our office. _______________________________________________________________  Recommendations made by the consultant and any test results will be sent to your referring physician. _______________________________________________________________

## 2014-11-20 NOTE — Progress Notes (Signed)
Roberto [name] presented for Portacath access and flush.  Proper placement of portacath confirmed by CXR.  Portacath located left chest wall accessed with  H 20 needle.  Good blood return present. Portacath flushed with 60ml NS and 500U/15ml Heparin and needle removed intact.  Procedure tolerated well and without incident.

## 2014-11-21 LAB — CEA: CEA: 6.2 ng/mL — ABNORMAL HIGH (ref 0.0–4.7)

## 2014-11-23 ENCOUNTER — Encounter (INDEPENDENT_AMBULATORY_CARE_PROVIDER_SITE_OTHER): Payer: Self-pay | Admitting: *Deleted

## 2014-11-23 ENCOUNTER — Encounter (HOSPITAL_COMMUNITY): Payer: Medicare Other

## 2014-11-30 ENCOUNTER — Other Ambulatory Visit (HOSPITAL_COMMUNITY): Payer: Self-pay | Admitting: *Deleted

## 2014-11-30 DIAGNOSIS — C189 Malignant neoplasm of colon, unspecified: Secondary | ICD-10-CM

## 2015-01-05 ENCOUNTER — Encounter (HOSPITAL_COMMUNITY): Payer: Medicare Other

## 2015-01-06 ENCOUNTER — Encounter (HOSPITAL_COMMUNITY): Payer: Self-pay

## 2015-01-06 ENCOUNTER — Encounter (HOSPITAL_COMMUNITY): Payer: Medicare Other | Attending: Hematology & Oncology

## 2015-01-06 VITALS — BP 132/68 | HR 83 | Temp 98.2°F | Resp 18

## 2015-01-06 DIAGNOSIS — C61 Malignant neoplasm of prostate: Secondary | ICD-10-CM | POA: Diagnosis not present

## 2015-01-06 DIAGNOSIS — Z95828 Presence of other vascular implants and grafts: Secondary | ICD-10-CM

## 2015-01-06 DIAGNOSIS — Z452 Encounter for adjustment and management of vascular access device: Secondary | ICD-10-CM | POA: Diagnosis present

## 2015-01-06 MED ORDER — HEPARIN SOD (PORK) LOCK FLUSH 100 UNIT/ML IV SOLN
500.0000 [IU] | Freq: Once | INTRAVENOUS | Status: AC
Start: 2015-01-06 — End: 2015-01-06
  Administered 2015-01-06: 500 [IU] via INTRAVENOUS

## 2015-01-06 MED ORDER — SODIUM CHLORIDE 0.9 % IJ SOLN
10.0000 mL | INTRAMUSCULAR | Status: DC | PRN
  Administered 2015-01-06: 10 mL via INTRAVENOUS
  Filled 2015-01-06: qty 10

## 2015-01-06 NOTE — Progress Notes (Signed)
Roberto [name] presented for Portacath access and flush. Portacath located in the left chest wall accessed with  H 20 needle. Clean, Dry and Intact Good blood return present. Portacath flushed with 29ml NS and 500U/61ml Heparin per protocol and needle removed intact. Procedure without incident. Patient tolerated procedure well.

## 2015-02-15 ENCOUNTER — Encounter (HOSPITAL_COMMUNITY): Payer: Medicare Other | Attending: Hematology & Oncology

## 2015-02-15 DIAGNOSIS — Z452 Encounter for adjustment and management of vascular access device: Secondary | ICD-10-CM

## 2015-02-15 DIAGNOSIS — Z85038 Personal history of other malignant neoplasm of large intestine: Secondary | ICD-10-CM | POA: Diagnosis not present

## 2015-02-15 DIAGNOSIS — C189 Malignant neoplasm of colon, unspecified: Secondary | ICD-10-CM | POA: Diagnosis not present

## 2015-02-15 DIAGNOSIS — Z8546 Personal history of malignant neoplasm of prostate: Secondary | ICD-10-CM | POA: Diagnosis not present

## 2015-02-15 DIAGNOSIS — Z95828 Presence of other vascular implants and grafts: Secondary | ICD-10-CM

## 2015-02-15 MED ORDER — HEPARIN SOD (PORK) LOCK FLUSH 100 UNIT/ML IV SOLN
INTRAVENOUS | Status: AC
  Filled 2015-02-15: qty 5

## 2015-02-15 MED ORDER — SODIUM CHLORIDE 0.9 % IJ SOLN
10.0000 mL | Freq: Once | INTRAMUSCULAR | Status: AC
  Administered 2015-02-15: 10 mL via INTRAVENOUS

## 2015-02-15 MED ORDER — HEPARIN SOD (PORK) LOCK FLUSH 100 UNIT/ML IV SOLN
500.0000 [IU] | Freq: Once | INTRAVENOUS | Status: AC
  Administered 2015-02-15: 500 [IU] via INTRAVENOUS

## 2015-02-15 NOTE — Patient Instructions (Signed)
[place] at [facility] Discharge Instructions  RECOMMENDATIONS MADE BY THE CONSULTANT AND ANY TEST RESULTS WILL BE SENT TO YOUR REFERRING PHYSICIAN.  Port flush today with CEA level as ordered. Return as scheduled 03/19/15 for MD appointment and port flush with lab work.  Thank you for choosing [place] at [facility] to provide your oncology and hematology care.  To afford each patient quality time with our provider, please arrive at least 15 minutes before your scheduled appointment time.    You need to re-schedule your appointment should you arrive 10 or more minutes late.  We strive to give you quality time with our providers, and arriving late affects you and other patients whose appointments are after yours.  Also, if you no show three or more times for appointments you may be dismissed from the clinic at the providers discretion.     Again, thank you for choosing [facility]  Our hope is that these requests will decrease the amount of time that you wait before being seen by our physicians.       _____________________________________________________________  Should you have questions after your visit to [facility], please contact our office at [phone] between the hours of 8:30 a.m. and 4:30 p.m.  Voicemails left after 4:30 p.m. will not be returned until the following business day.  For prescription refill requests, have your pharmacy contact our office.

## 2015-02-15 NOTE — Progress Notes (Signed)
Roberto [name] presented for Portacath access and flush. Proper placement of portacath confirmed by CXR. Portacath located left chest wall accessed with  H 20 needle. Good blood return present. Portacath flushed with 75ml NS and 500U/37ml Heparin and needle removed intact. Procedure without incident. Patient tolerated procedure well.

## 2015-02-16 LAB — CEA: CEA: 6 ng/mL — ABNORMAL HIGH (ref 0.0–4.7)

## 2015-03-17 ENCOUNTER — Other Ambulatory Visit (HOSPITAL_COMMUNITY): Payer: Medicare Other

## 2015-03-19 ENCOUNTER — Encounter (HOSPITAL_COMMUNITY): Payer: Medicare Other | Attending: Hematology & Oncology | Admitting: Hematology & Oncology

## 2015-03-19 ENCOUNTER — Encounter (HOSPITAL_BASED_OUTPATIENT_CLINIC_OR_DEPARTMENT_OTHER): Payer: Medicare Other

## 2015-03-19 VITALS — BP 155/73 | HR 72 | Temp 98.7°F | Resp 20 | Wt 185.3 lb

## 2015-03-19 DIAGNOSIS — C61 Malignant neoplasm of prostate: Secondary | ICD-10-CM

## 2015-03-19 DIAGNOSIS — Z720 Tobacco use: Secondary | ICD-10-CM | POA: Diagnosis not present

## 2015-03-19 DIAGNOSIS — Z9889 Other specified postprocedural states: Secondary | ICD-10-CM | POA: Diagnosis present

## 2015-03-19 DIAGNOSIS — C189 Malignant neoplasm of colon, unspecified: Secondary | ICD-10-CM

## 2015-03-19 DIAGNOSIS — Z95828 Presence of other vascular implants and grafts: Secondary | ICD-10-CM

## 2015-03-19 LAB — CBC WITH DIFFERENTIAL/PLATELET
Basophils Absolute: 0 10*3/uL (ref 0.0–0.1)
Basophils Relative: 0 % (ref 0–1)
Eosinophils Absolute: 0.2 10*3/uL (ref 0.0–0.7)
Eosinophils Relative: 2 % (ref 0–5)
HEMATOCRIT: 44 % (ref 39.0–52.0)
HEMOGLOBIN: 14.5 g/dL (ref 13.0–17.0)
LYMPHS ABS: 3.4 10*3/uL (ref 0.7–4.0)
LYMPHS PCT: 31 % (ref 12–46)
MCH: 30.4 pg (ref 26.0–34.0)
MCHC: 33 g/dL (ref 30.0–36.0)
MCV: 92.2 fL (ref 78.0–100.0)
MONO ABS: 1.1 10*3/uL — AB (ref 0.1–1.0)
MONOS PCT: 10 % (ref 3–12)
NEUTROS ABS: 6.4 10*3/uL (ref 1.7–7.7)
Neutrophils Relative %: 57 % (ref 43–77)
Platelets: 268 10*3/uL (ref 150–400)
RBC: 4.77 MIL/uL (ref 4.22–5.81)
RDW: 14.3 % (ref 11.5–15.5)
WBC: 11.1 10*3/uL — ABNORMAL HIGH (ref 4.0–10.5)

## 2015-03-19 LAB — COMPREHENSIVE METABOLIC PANEL
ALT: 13 U/L — AB (ref 17–63)
AST: 15 U/L (ref 15–41)
Albumin: 4 g/dL (ref 3.5–5.0)
Alkaline Phosphatase: 85 U/L (ref 38–126)
Anion gap: 6 (ref 5–15)
BILIRUBIN TOTAL: 0.5 mg/dL (ref 0.3–1.2)
BUN: 16 mg/dL (ref 6–20)
CHLORIDE: 108 mmol/L (ref 101–111)
CO2: 27 mmol/L (ref 22–32)
CREATININE: 0.99 mg/dL (ref 0.61–1.24)
Calcium: 9.3 mg/dL (ref 8.9–10.3)
GFR calc Af Amer: >60 mL/min (ref >60)
GLUCOSE: 100 mg/dL — AB (ref 65–99)
Potassium: 4.2 mmol/L (ref 3.5–5.1)
Sodium: 141 mmol/L (ref 135–145)
Total Protein: 7.5 g/dL (ref 6.5–8.1)

## 2015-03-19 LAB — PSA: PSA: 1.5 ng/mL (ref 0.00–4.00)

## 2015-03-19 MED ORDER — HEPARIN SOD (PORK) LOCK FLUSH 100 UNIT/ML IV SOLN
INTRAVENOUS | Status: AC
  Filled 2015-03-19: qty 5

## 2015-03-19 MED ORDER — HEPARIN SOD (PORK) LOCK FLUSH 100 UNIT/ML IV SOLN
500.0000 [IU] | Freq: Once | INTRAVENOUS | Status: AC
  Administered 2015-03-19: 500 [IU] via INTRAVENOUS

## 2015-03-19 MED ORDER — SODIUM CHLORIDE 0.9 % IJ SOLN
10.0000 mL | Freq: Once | INTRAMUSCULAR | Status: AC
  Administered 2015-03-19: 10 mL via INTRAVENOUS

## 2015-03-19 NOTE — Progress Notes (Signed)
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 2. Prostate cancer status post radical prostatectomy in October 2004, low PSA velocity..  3. 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  4. Port-A-Cath in place and working well . 5. Pulmonary asbestosis.  DIAGNOSIS: No matching staging information was found for the patient.  SUMMARY OF ONCOLOGIC HISTORY:   Colon cancer   05/30/2011 Initial Diagnosis Colon cancer    CURRENT THERAPY: Observation  INTERVAL HISTORY: Roberto [name] 74 y.o. male returns for follow-up of his CRC and prostate cancer.    The patient is overall doing well.  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  The patient is currently having observation of his rising PSA. He is following with a urologist in [place].Roberto [name]  His last check was about a month ago and he will have another one in 3 months.  If his PSA levels get to 2, he will recieve radiation.  He expresses whether he should get "shots" during radiation if he needs it. He knows several other men who have been through a similar situation.  The patient denies any problems with his bowels or eating levels.  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  He is still smoking 1 ppd. MEDICAL HISTORY: Past Medical History  Diagnosis Date  . Varicose veins of lower limb 1985    rt. leg  . 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 Prostate cancer     prostate 2004/surg  . Colon cancer     colon 2010/surg and chemo  . Port-a-cath in place 04/02/2013  . Hypercholesteremia     has Colon cancer; Port-a-cath in place; Prostate cancer; and Leukocytosis on his problem list.     is allergic to  tetanus toxoids.  Mr. Littles does not currently have medications on file.  SURGICAL HISTORY: Past Surgical History  Procedure Laterality Date  . Colon surgery    . Prostate surgery    . Styloid process excision    . Hand tendon surgery      lt. hand repair  . Varicose vein surgery  1980's  . Colonoscopy N/A 11/11/2014    Procedure: COLONOSCOPY;  Surgeon: Rogene Houston, MD;  Location: [facility];  Service: Endoscopy;  Laterality: N/A;  830    SOCIAL HISTORY: Social History   Social History  . Marital Status: Married    Spouse Name: N/A  . Number of Children: N/A  . Years of Education: N/A   Occupational History  . Not on file.   Social History Main Topics  . Smoking status: Current Every Day Smoker -- 1.00 packs/day for 51 years    Types: Cigarettes  . Smokeless tobacco: Never Used  . Alcohol Use: No  . Drug Use: No  . Sexual Activity: Not on file   Other Topics Concern  . Not on file   Social History Narrative    FAMILY HISTORY: Family History  Problem Relation Age of Onset  . Cancer Father     Review of Systems  Constitutional: Negative for fever, chills, weight loss and malaise/fatigue.  HENT: Negative for congestion, hearing loss, nosebleeds, sore throat and tinnitus.  Eyes: Negative for blurred vision, double vision, pain and discharge.  Respiratory: Negative for cough, hemoptysis, sputum production, shortness of breath and wheezing.   Cardiovascular: Negative for chest pain, palpitations, claudication, leg swelling and PND.  Gastrointestinal: Negative for heartburn, nausea, vomiting, abdominal pain, diarrhea, constipation, blood in stool and melena.  Genitourinary: Negative for dysuria, urgency, frequency and hematuria.  Musculoskeletal: Negative for myalgias, joint pain and falls.  Skin: Negative for itching and rash.  Neurological: Negative for dizziness, tingling, tremors, sensory change, speech change, focal weakness, seizures, loss of  consciousness, weakness and headaches.  Endo/Heme/Allergies: Does not bruise/bleed easily.  Psychiatric/Behavioral: Negative for depression, suicidal ideas, memory loss and substance abuse. The patient is not nervous/anxious and does not have insomnia.   14 point review of systems was performed and is negative except as detailed under history of present illness and above   PHYSICAL EXAMINATION  ECOG PERFORMANCE STATUS: 0 - Asymptomatic  Filed Vitals:   03/19/15 1124  BP: 155/73  Pulse: 72  Temp: 98.7 F (37.1 C)  Resp: 20    Physical Exam  Constitutional: He is oriented to person, place, and time and well-developed, well-nourished, and in no distress.  HENT:  Head: Normocephalic and atraumatic.  Nose: Nose normal.  Mouth/Throat: Oropharynx is clear and moist. No oropharyngeal exudate.  Eyes: Conjunctivae and EOM are normal. Pupils are equal, round, and reactive to light. Right eye exhibits no discharge. Left eye exhibits no discharge. No scleral icterus.  Neck: Normal range of motion. Neck supple. No tracheal deviation present. No thyromegaly present.  Cardiovascular: Normal rate, regular rhythm and normal heart sounds.  Exam reveals no gallop and no friction rub.   No murmur heard. Pulmonary/Chest: Effort normal and breath sounds normal. He has no wheezes. He has no rales.  Abdominal: Soft. Bowel sounds are normal. He exhibits no distension and no mass. There is no tenderness. There is no rebound and no guarding.  Musculoskeletal: Normal range of motion. He exhibits no edema.  Lymphadenopathy:    He has no cervical adenopathy.  Neurological: He is alert and oriented to person, place, and time. He has normal reflexes. No cranial nerve deficit. Gait normal. Coordination normal.  Skin: Skin is warm and dry. No rash noted.  Psychiatric: Mood, memory, affect and judgment normal.  Nursing note and vitals reviewed.   LABORATORY DATA:  CBC    Component Value Date/Time   WBC  11.1* 03/19/2015 1210   RBC 4.77 03/19/2015 1210   HGB 14.5 03/19/2015 1210   HCT 44.0 03/19/2015 1210   PLT 268 03/19/2015 1210   MCV 92.2 03/19/2015 1210   MCH 30.4 03/19/2015 1210   MCHC 33.0 03/19/2015 1210   RDW 14.3 03/19/2015 1210   LYMPHSABS 3.4 03/19/2015 1210   MONOABS 1.1* 03/19/2015 1210   EOSABS 0.2 03/19/2015 1210   BASOSABS 0.0 03/19/2015 1210   CMP     Component Value Date/Time   NA 141 03/19/2015 1210   K 4.2 03/19/2015 1210   CL 108 03/19/2015 1210   CO2 27 03/19/2015 1210   GLUCOSE 100* 03/19/2015 1210   BUN 16 03/19/2015 1210   CREATININE 0.99 03/19/2015 1210   CALCIUM 9.3 03/19/2015 1210   PROT 7.5 03/19/2015 1210   ALBUMIN 4.0 03/19/2015 1210   AST 15 03/19/2015 1210   ALT 13* 03/19/2015 1210   ALKPHOS 85 03/19/2015 1210   BILITOT 0.5 03/19/2015 1210   GFRNONAA >60 03/19/2015 1210   GFRAA >60 03/19/2015 1210   Results for  RAFFERTY, POSTLEWAIT (MRN [id number]) as of 04/18/2015 15:43  Ref. Range 03/19/2015 12:10  CEA Latest Ref Range: 0.0-4.7 ng/mL 5.9 (H)  PSA Latest Ref Range: 0.00-4.00 ng/mL 1.50    RADIOLOGY:  CLINICAL DATA: Colon cancer diagnosed in 2010 post surgery and chemotherapy. Remote history of prostate cancer with surgery. Rising serum PSA levels. Initial encounter.  EXAM: 09/16/2014 CT CHEST, ABDOMEN, AND PELVIS WITH CONTRAST IMPRESSION: 1. No evidence of metastatic disease from the patient's colon or prostate cancer. 2. Stable atherosclerosis. 3. Stable bilateral adrenal hyperplasia.   Electronically Signed  By: Richardean Sale M.D.  On: 09/16/2014 15:29  CLINICAL DATA: Prostate cancer. Rising PSA. History of colon cancer.  EXAM: 09/16/2014 NUCLEAR MEDICINE WHOLE BODY BONE SCAN IMPRESSION: No findings suggestive of metastatic disease to the skeleton.   Electronically Signed  By: Lorriane Shire M.D.  On: 09/16/2014 12:43  ASSESSMENT and THERAPY PLAN:   Stage IIIB cancer of the ascending colon (T4 A., N1  C., M0) grade 2 cancer with surgery on 02/22/2009 followed by 6 cycles of XELOX finishing all therapy as of 07/21/2009, last CT in July 2015 showed only evidence of asbestosis. 2. Prostate cancer status post radical prostatectomy in October 2004, low PSA velocity..  3. 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  4. Port-A-Cath in place and working well . 5. Pulmonary asbestosis. 6. Colonoscopy on 11/11/2014 with repeat recommended in 3 years  He is now 5 years out from his colon cancer diagnosis. CEA remains elevated but the patient is a smoker. Last imaging studies were unremarkable. He is up-to-date on screening colonoscopy.  He continues to follow with urology in [place] for a rising PSA in the setting of prior prostatectomy. It sounds like he is a good plan in place for observation and initiation of radiation if needed.  I have discussed smoking cessation with the patient in detail. He continues to struggle with this. He is open to considering it but prefers to try to quit on his own. I again offered him behavioral modification classes, chantix etc.  All questions were answered. The patient knows to call the clinic with any problems, questions or concerns. We can certainly see the patient much sooner if necessary.   Follow up in 6 months, he would like to discuss taking his port out at this time.  This document serves as a record of services personally performed by Ancil Linsey, MD. It was created on her behalf by Janace Hoard, a trained medical scribe. The creation of this record is based on the scribe's personal observations and the provider's statements to them. This document has been checked and approved by the attending provider.  I have reviewed the above documentation for accuracy and completeness, and I agree with the above.  This note was electronically signed.  Kelby Fam. Whitney Muse, MD

## 2015-03-19 NOTE — Progress Notes (Signed)
Elmer Sow presented for Portacath access and flush.  Proper placement of portacath confirmed by CXR.  Portacath located left chest wall accessed with  H 20 needle.  No blood return and flushed easily Portacath flushed with 76ml NS and 500U/66ml Heparin and needle removed intact.  Procedure tolerated well and without incident.

## 2015-03-19 NOTE — Patient Instructions (Signed)
[place] at [facility] Discharge Instructions  RECOMMENDATIONS MADE BY THE CONSULTANT AND ANY TEST RESULTS WILL BE SENT TO YOUR REFERRING PHYSICIAN.  Port flush today Follow up as scheduled   Thank you for choosing [place] at [facility] to provide your oncology and hematology care.  To afford each patient quality time with our provider, please arrive at least 15 minutes before your scheduled appointment time.    You need to re-schedule your appointment should you arrive 10 or more minutes late.  We strive to give you quality time with our providers, and arriving late affects you and other patients whose appointments are after yours.  Also, if you no show three or more times for appointments you may be dismissed from the clinic at the providers discretion.     Again, thank you for choosing [facility].  Our hope is that these requests will decrease the amount of time that you wait before being seen by our physicians.       _____________________________________________________________  Should you have questions after your visit to [facility], please contact our office at [phone] between the hours of 8:30 a.m. and 4:30 p.m.  Voicemails left after 4:30 p.m. will not be returned until the following business day.  For prescription refill requests, have your pharmacy contact our office.

## 2015-03-19 NOTE — Patient Instructions (Signed)
..  [place] at [facility] Discharge Instructions  RECOMMENDATIONS MADE BY THE CONSULTANT AND ANY TEST RESULTS WILL BE SENT TO YOUR REFERRING PHYSICIAN.  Return in six months for follow up as well as a discussion about having your port out.   Thank you for choosing [place] at [facility] to provide your oncology and hematology care.  To afford each patient quality time with our provider, please arrive at least 15 minutes before your scheduled appointment time.    You need to re-schedule your appointment should you arrive 10 or more minutes late.  We strive to give you quality time with our providers, and arriving late affects you and other patients whose appointments are after yours.  Also, if you no show three or more times for appointments you may be dismissed from the clinic at the providers discretion.     Again, thank you for choosing [facility].  Our hope is that these requests will decrease the amount of time that you wait before being seen by our physicians.       _____________________________________________________________  Should you have questions after your visit to [facility], please contact our office at [phone] between the hours of 8:30 a.m. and 4:30 p.m.  Voicemails left after 4:30 p.m. will not be returned until the following business day.  For prescription refill requests, have your pharmacy contact our office.

## 2015-03-20 LAB — CEA: CEA: 5.9 ng/mL — AB (ref 0.0–4.7)

## 2015-04-18 ENCOUNTER — Encounter (HOSPITAL_COMMUNITY): Payer: Self-pay | Admitting: Hematology & Oncology

## 2015-05-14 ENCOUNTER — Encounter (HOSPITAL_COMMUNITY): Payer: Self-pay

## 2015-05-14 ENCOUNTER — Encounter (HOSPITAL_COMMUNITY): Payer: Medicare Other | Attending: Hematology & Oncology

## 2015-05-14 VITALS — BP 140/53 | HR 75 | Resp 20

## 2015-05-14 DIAGNOSIS — Z23 Encounter for immunization: Secondary | ICD-10-CM | POA: Diagnosis present

## 2015-05-14 DIAGNOSIS — Z95828 Presence of other vascular implants and grafts: Secondary | ICD-10-CM

## 2015-05-14 DIAGNOSIS — C61 Malignant neoplasm of prostate: Secondary | ICD-10-CM | POA: Insufficient documentation

## 2015-05-14 DIAGNOSIS — C189 Malignant neoplasm of colon, unspecified: Secondary | ICD-10-CM | POA: Insufficient documentation

## 2015-05-14 DIAGNOSIS — Z9889 Other specified postprocedural states: Secondary | ICD-10-CM | POA: Insufficient documentation

## 2015-05-14 MED ORDER — HEPARIN SOD (PORK) LOCK FLUSH 100 UNIT/ML IV SOLN
500.0000 [IU] | Freq: Once | INTRAVENOUS | Status: AC
  Administered 2015-05-14: 500 [IU] via INTRAVENOUS
  Filled 2015-05-14: qty 5

## 2015-05-14 MED ORDER — INFLUENZA VAC SPLIT QUAD 0.5 ML IM SUSY
0.5000 mL | PREFILLED_SYRINGE | Freq: Once | INTRAMUSCULAR | Status: AC
  Administered 2015-05-14: 0.5 mL via INTRAMUSCULAR

## 2015-05-14 MED ORDER — INFLUENZA VAC SPLIT QUAD 0.5 ML IM SUSY
PREFILLED_SYRINGE | INTRAMUSCULAR | Status: AC
  Filled 2015-05-14: qty 0.5

## 2015-05-14 MED ORDER — SODIUM CHLORIDE 0.9 % IJ SOLN
10.0000 mL | INTRAMUSCULAR | Status: DC | PRN
  Administered 2015-05-14: 10 mL via INTRAVENOUS
  Filled 2015-05-14: qty 10

## 2015-05-14 NOTE — Progress Notes (Signed)
Roberto [name] presented for Portacath access and flush.  Proper placement of portacath confirmed by CXR.  Portacath located left chest wall accessed with  H 20 needle.  No blood return and flushed easily Portacath flushed with 58ml NS and 500U/6ml Heparin and needle removed intact.  Procedure tolerated well and without incident.   Flu vaccine administered

## 2015-05-14 NOTE — Patient Instructions (Signed)
Port flush Follow up as scheduled Please call the clinic if you have any questions or concerns 

## 2015-07-09 ENCOUNTER — Encounter (HOSPITAL_COMMUNITY): Payer: Medicare Other | Attending: Hematology & Oncology

## 2015-07-09 VITALS — BP 156/75 | HR 82 | Temp 98.2°F | Resp 18

## 2015-07-09 DIAGNOSIS — Z85038 Personal history of other malignant neoplasm of large intestine: Secondary | ICD-10-CM | POA: Diagnosis not present

## 2015-07-09 DIAGNOSIS — Z95828 Presence of other vascular implants and grafts: Secondary | ICD-10-CM

## 2015-07-09 DIAGNOSIS — Z452 Encounter for adjustment and management of vascular access device: Secondary | ICD-10-CM

## 2015-07-09 DIAGNOSIS — Z9889 Other specified postprocedural states: Secondary | ICD-10-CM | POA: Insufficient documentation

## 2015-07-09 DIAGNOSIS — C61 Malignant neoplasm of prostate: Secondary | ICD-10-CM | POA: Insufficient documentation

## 2015-07-09 DIAGNOSIS — C189 Malignant neoplasm of colon, unspecified: Secondary | ICD-10-CM | POA: Insufficient documentation

## 2015-07-09 MED ORDER — HEPARIN SOD (PORK) LOCK FLUSH 100 UNIT/ML IV SOLN
INTRAVENOUS | Status: AC
  Filled 2015-07-09: qty 5

## 2015-07-09 MED ORDER — SODIUM CHLORIDE 0.9 % IJ SOLN
10.0000 mL | INTRAMUSCULAR | Status: DC | PRN
  Administered 2015-07-09: 10 mL via INTRAVENOUS
  Filled 2015-07-09: qty 10

## 2015-07-09 MED ORDER — HEPARIN SOD (PORK) LOCK FLUSH 100 UNIT/ML IV SOLN
500.0000 [IU] | Freq: Once | INTRAVENOUS | Status: AC
  Administered 2015-07-09: 500 [IU] via INTRAVENOUS

## 2015-07-09 NOTE — Progress Notes (Signed)
..  Roberto [name] presented for Portacath access and flush.  Proper placement of portacath confirmed by CXR.  Portacath located lt chest wall accessed with  H 20 needle.  Good blood return present. Portacath flushed with 2ml NS and 500U/64ml Heparin and needle removed intact.  Procedure tolerated well and without incident.

## 2015-07-13 ENCOUNTER — Other Ambulatory Visit (HOSPITAL_COMMUNITY): Payer: Self-pay | Admitting: Hematology & Oncology

## 2015-09-03 ENCOUNTER — Encounter (HOSPITAL_BASED_OUTPATIENT_CLINIC_OR_DEPARTMENT_OTHER): Payer: Medicare Other | Admitting: Hematology & Oncology

## 2015-09-03 ENCOUNTER — Encounter (HOSPITAL_COMMUNITY): Payer: Self-pay | Admitting: Hematology & Oncology

## 2015-09-03 ENCOUNTER — Encounter (HOSPITAL_COMMUNITY): Payer: Medicare Other | Attending: Hematology & Oncology

## 2015-09-03 VITALS — BP 147/65 | HR 78 | Temp 98.1°F | Resp 20 | Wt 190.0 lb

## 2015-09-03 DIAGNOSIS — Z8546 Personal history of malignant neoplasm of prostate: Secondary | ICD-10-CM

## 2015-09-03 DIAGNOSIS — J438 Other emphysema: Secondary | ICD-10-CM

## 2015-09-03 DIAGNOSIS — C61 Malignant neoplasm of prostate: Secondary | ICD-10-CM | POA: Diagnosis not present

## 2015-09-03 DIAGNOSIS — Z85038 Personal history of other malignant neoplasm of large intestine: Secondary | ICD-10-CM

## 2015-09-03 DIAGNOSIS — C189 Malignant neoplasm of colon, unspecified: Secondary | ICD-10-CM

## 2015-09-03 DIAGNOSIS — Z95828 Presence of other vascular implants and grafts: Secondary | ICD-10-CM

## 2015-09-03 DIAGNOSIS — Z720 Tobacco use: Secondary | ICD-10-CM

## 2015-09-03 LAB — COMPREHENSIVE METABOLIC PANEL
ALBUMIN: 3.6 g/dL (ref 3.5–5.0)
ALT: 13 U/L — ABNORMAL LOW (ref 17–63)
ANION GAP: 6 (ref 5–15)
AST: 16 U/L (ref 15–41)
Alkaline Phosphatase: 84 U/L (ref 38–126)
BILIRUBIN TOTAL: 0.3 mg/dL (ref 0.3–1.2)
BUN: 17 mg/dL (ref 6–20)
CHLORIDE: 106 mmol/L (ref 101–111)
CO2: 29 mmol/L (ref 22–32)
Calcium: 8.8 mg/dL — ABNORMAL LOW (ref 8.9–10.3)
Creatinine, Ser: 1.07 mg/dL (ref 0.61–1.24)
GFR calc Af Amer: >60 mL/min (ref >60)
Glucose, Bld: 103 mg/dL — ABNORMAL HIGH (ref 65–99)
POTASSIUM: 4.1 mmol/L (ref 3.5–5.1)
Sodium: 141 mmol/L (ref 135–145)
TOTAL PROTEIN: 6.6 g/dL (ref 6.5–8.1)

## 2015-09-03 LAB — CBC WITH DIFFERENTIAL/PLATELET
BASOS ABS: 0 10*3/uL (ref 0.0–0.1)
BASOS PCT: 0 %
EOS PCT: 3 %
Eosinophils Absolute: 0.3 10*3/uL (ref 0.0–0.7)
HEMATOCRIT: 42.3 % (ref 39.0–52.0)
Hemoglobin: 13.9 g/dL (ref 13.0–17.0)
Lymphocytes Relative: 35 %
Lymphs Abs: 3.9 10*3/uL (ref 0.7–4.0)
MCH: 30 pg (ref 26.0–34.0)
MCHC: 32.9 g/dL (ref 30.0–36.0)
MCV: 91.4 fL (ref 78.0–100.0)
MONO ABS: 1.1 10*3/uL — AB (ref 0.1–1.0)
MONOS PCT: 10 %
NEUTROS ABS: 5.7 10*3/uL (ref 1.7–7.7)
Neutrophils Relative %: 52 %
PLATELETS: 284 10*3/uL (ref 150–400)
RBC: 4.63 MIL/uL (ref 4.22–5.81)
RDW: 14.3 % (ref 11.5–15.5)
WBC: 11.1 10*3/uL — ABNORMAL HIGH (ref 4.0–10.5)

## 2015-09-03 MED ORDER — HEPARIN SOD (PORK) LOCK FLUSH 100 UNIT/ML IV SOLN
500.0000 [IU] | Freq: Once | INTRAVENOUS | Status: AC
  Administered 2015-09-03: 500 [IU] via INTRAVENOUS

## 2015-09-03 MED ORDER — SODIUM CHLORIDE 0.9% FLUSH
10.0000 mL | INTRAVENOUS | Status: DC | PRN
  Administered 2015-09-03: 10 mL via INTRAVENOUS
  Filled 2015-09-03: qty 10

## 2015-09-03 MED ORDER — HEPARIN SOD (PORK) LOCK FLUSH 100 UNIT/ML IV SOLN
INTRAVENOUS | Status: AC
  Filled 2015-09-03: qty 5

## 2015-09-03 NOTE — Progress Notes (Signed)
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 2. Prostate cancer status post radical prostatectomy in October 2004, low PSA velocity..  3. 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  4. Port-A-Cath in place and working well . 5. Pulmonary asbestosis.  DIAGNOSIS: No matching staging information was found for the patient.  SUMMARY OF ONCOLOGIC HISTORY:   Colon cancer ([place])   05/30/2011 Initial Diagnosis Colon cancer    CURRENT THERAPY: Observation  INTERVAL HISTORY: JAHAIRE [name] 75 y.o. male returns for follow-up of his CRC and prostate cancer.    Roberto [name] is here alone today. He is eating and sleeping well. He continues to smoke. He takes care of his wife at home. He continues to work. Denies any falls, blood in stool, or bowel issues.  The patient continues active observation with urology every 6 months for his prostate. He is not receiving hormone shots. They have mentioned radiation treatment but he does not want to go through that until it is needed.  He is interested in having his port removed. We will refer him to Dr. Arnoldo Morale for a port removal.   He is without any major concerns today.  MEDICAL HISTORY: Past Medical History  Diagnosis Date  . Varicose veins of lower limb 1985    rt. leg  . 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 Prostate cancer ([place])     prostate 2004/surg  . Colon cancer ([place])     colon 2010/surg and chemo  . Port-a-cath in place 04/02/2013  . Hypercholesteremia     has Colon cancer ([place]); Port-a-cath in place; Prostate cancer ([place]); and Leukocytosis on his problem list.     is allergic to tetanus toxoids.  [name] does not currently have medications on  file.  SURGICAL HISTORY: Past Surgical History  Procedure Laterality Date  . Colon surgery    . Prostate surgery    . Styloid process excision    . Hand tendon surgery      lt. hand repair  . Varicose vein surgery  1980's  . Colonoscopy N/A 11/11/2014    Procedure: COLONOSCOPY;  Surgeon: Rogene Houston, MD;  Location: [facility];  Service: Endoscopy;  Laterality: N/A;  830    SOCIAL HISTORY: Social History   Social History  . Marital Status: Married    Spouse Name: N/A  . Number of Children: N/A  . Years of Education: N/A   Occupational History  . Not on file.   Social History Main Topics  . Smoking status: Current Every Day Smoker -- 1.00 packs/day for 51 years    Types: Cigarettes  . Smokeless tobacco: Never Used  . Alcohol Use: No  . Drug Use: No  . Sexual Activity: Not on file   Other Topics Concern  . Not on file   Social History Narrative    FAMILY HISTORY: Family History  Problem Relation Age of Onset  . Cancer Father     Review of Systems  Constitutional: Negative for fever, chills, weight loss and malaise/fatigue.  HENT: Negative for congestion, hearing loss, nosebleeds, sore throat and tinnitus.   Eyes: Negative for blurred vision, double vision, pain and discharge.  Respiratory: Negative for cough, hemoptysis,  sputum production, shortness of breath and wheezing.   Cardiovascular: Negative for chest pain, palpitations, claudication, leg swelling and PND.  Gastrointestinal: Negative for heartburn, nausea, vomiting, abdominal pain, diarrhea, constipation, blood in stool and melena.  Genitourinary: Negative for dysuria, urgency, frequency and hematuria.  Musculoskeletal: Negative for myalgias, joint pain and falls.  Skin: Negative for itching and rash.  Neurological: Negative for dizziness, tingling, tremors, sensory change, speech change, focal weakness, seizures, loss of consciousness, weakness and headaches.  Endo/Heme/Allergies: Does not  bruise/bleed easily.  Psychiatric/Behavioral: Negative for depression, suicidal ideas, memory loss and substance abuse. The patient is not nervous/anxious and does not have insomnia.   14 point review of systems was performed and is negative except as detailed under history of present illness and above   PHYSICAL EXAMINATION  ECOG PERFORMANCE STATUS: 0 - Asymptomatic  Filed Vitals:   09/03/15 1414  BP: 147/65  Pulse: 78  Temp: 98.1 F (36.7 C)  Resp: 20    Physical Exam  Constitutional: He is oriented to person, place, and time and well-developed, well-nourished, and in no distress.  HENT:  Head: Normocephalic and atraumatic.  Nose: Nose normal.  Mouth/Throat: Oropharynx is clear and moist. No oropharyngeal exudate.  Eyes: Conjunctivae and EOM are normal. Pupils are equal, round, and reactive to light. Right eye exhibits no discharge. Left eye exhibits no discharge. No scleral icterus.  Neck: Normal range of motion. Neck supple. No tracheal deviation present. No thyromegaly present.  Cardiovascular: Normal rate, regular rhythm and normal heart sounds.  Exam reveals no gallop and no friction rub.   No murmur heard. Pulmonary/Chest: Effort normal and breath sounds normal. He has no wheezes. He has no rales.  Abdominal: Soft. Bowel sounds are normal. He exhibits no distension and no mass. There is no tenderness. There is no rebound and no guarding.  Musculoskeletal: Normal range of motion. He exhibits no edema.  Lymphadenopathy:    He has no cervical adenopathy.  Neurological: He is alert and oriented to person, place, and time. He has normal reflexes. No cranial nerve deficit. Gait normal. Coordination normal.  Skin: Skin is warm and dry. No rash noted.  Psychiatric: Mood, memory, affect and judgment normal.  Nursing note and vitals reviewed.   LABORATORY DATA: I have reviewed the data as listed. CBC    Component Value Date/Time   WBC 11.1* 09/03/2015 1445   RBC 4.63  09/03/2015 1445   HGB 13.9 09/03/2015 1445   HCT 42.3 09/03/2015 1445   PLT 284 09/03/2015 1445   MCV 91.4 09/03/2015 1445   MCH 30.0 09/03/2015 1445   MCHC 32.9 09/03/2015 1445   RDW 14.3 09/03/2015 1445   LYMPHSABS 3.9 09/03/2015 1445   MONOABS 1.1* 09/03/2015 1445   EOSABS 0.3 09/03/2015 1445   BASOSABS 0.0 09/03/2015 1445   CMP     Component Value Date/Time   NA 141 09/03/2015 1445   K 4.1 09/03/2015 1445   CL 106 09/03/2015 1445   CO2 29 09/03/2015 1445   GLUCOSE 103* 09/03/2015 1445   BUN 17 09/03/2015 1445   CREATININE 1.07 09/03/2015 1445   CALCIUM 8.8* 09/03/2015 1445   PROT 6.6 09/03/2015 1445   ALBUMIN 3.6 09/03/2015 1445   AST 16 09/03/2015 1445   ALT 13* 09/03/2015 1445   ALKPHOS 84 09/03/2015 1445   BILITOT 0.3 09/03/2015 1445   GFRNONAA >60 09/03/2015 1445   GFRAA >60 09/03/2015 1445   Results for DENVIL, ALEXIOU (MRN [id number]) as of 04/18/2015 15:43  Ref. Range 03/19/2015 12:10  CEA Latest Ref Range: 0.0-4.7 ng/mL 5.9 (H)  PSA Latest Ref Range: 0.00-4.00 ng/mL 1.50    RADIOLOGY: I have personally reviewed the radiological images as listed and agreed with the findings in the report.  CLINICAL DATA: Colon cancer diagnosed in 2010 post surgery and chemotherapy. Remote history of prostate cancer with surgery. Rising serum PSA levels. Initial encounter.  EXAM: 09/16/2014 CT CHEST, ABDOMEN, AND PELVIS WITH CONTRAST IMPRESSION: 1. No evidence of metastatic disease from the patient's colon or prostate cancer. 2. Stable atherosclerosis. 3. Stable bilateral adrenal hyperplasia.   Electronically Signed  By: Richardean Sale M.D.  On: 09/16/2014 15:29  CLINICAL DATA: Prostate cancer. Rising PSA. History of colon cancer.  EXAM: 09/16/2014 NUCLEAR MEDICINE WHOLE BODY BONE SCAN IMPRESSION: No findings suggestive of metastatic disease to the skeleton.   Electronically Signed  By: Lorriane Shire M.D.  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 2. Prostate cancer status post radical prostatectomy in October 2004, low PSA velocity..  3. 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  4. Port-A-Cath in place and working well . 5. Pulmonary asbestosis. 6. Colonoscopy on 11/11/2014 with repeat recommended in 3 years 7. Persistently elevated CEA, patient is a smoker  He is now 5 years out from his colon cancer diagnosis. CEA remains elevated but the patient is a smoker. Last imaging studies were unremarkable. He is up-to-date on screening colonoscopy.  He continues to follow with urology in [place] for a rising PSA in the setting of prior prostatectomy. It sounds like he is a good plan in place for observation and initiation of radiation if needed.  I have discussed smoking cessation with the patient in detail. He continues to struggle with this. He is open to considering it but prefers to try to quit on his own. I again offered him behavioral modification classes, chantix etc.  Mr. Borts would like to have his port removed. I have referred him to Dr. Arnoldo Morale for removal.  I will see him again in 1 year with repeat labs.   All questions were answered. The patient knows to call the clinic with any problems, questions or concerns. We can certainly see the patient much sooner if necessary.   This document serves as a record of services personally performed by Ancil Linsey, MD. It was created on her behalf by Arlyce Harman, a trained medical scribe. The creation of this record is based on the scribe's personal observations and the provider's statements to them. This document has been checked and approved by the attending provider.  I have reviewed the above documentation  for accuracy and completeness, and I agree with the above.  This note was electronically signed.  Kelby Fam. Whitney Muse, MD

## 2015-09-03 NOTE — Progress Notes (Signed)
Performed at MD visit

## 2015-09-03 NOTE — Patient Instructions (Signed)
[place] at [facility] Discharge Instructions  RECOMMENDATIONS MADE BY THE CONSULTANT AND ANY TEST RESULTS WILL BE SENT TO YOUR REFERRING PHYSICIAN.  -We will see you in one year.  Please see your appointment list for return appointment.   -We will refer you to Dr. Arnoldo Morale to remove your port.   -Continue to follow up with Urology.      Thank you for choosing [place] at [facility] to provide your oncology and hematology care.  To afford each patient quality time with our provider, please arrive at least 15 minutes before your scheduled appointment time.   Beginning January 23rd 2017 lab work for the [facility] will be done in the  Main lab at [facility] on 1st floor. If you have a lab appointment with the [facility] please come in thru the  Main Entrance and check in at the main information desk  You need to re-schedule your appointment should you arrive 10 or more minutes late.  We strive to give you quality time with our providers, and arriving late affects you and other patients whose appointments are after yours.  Also, if you no show three or more times for appointments you may be dismissed from the clinic at the providers discretion.     Again, thank you for choosing [facility].  Our hope is that these requests will decrease the amount of time that you wait before being seen by our physicians.       _____________________________________________________________  Should you have questions after your visit to [facility], please contact our office at [phone] between the hours of 8:30 a.m. and 4:30 p.m.  Voicemails left after 4:30 p.m. will not be returned until the following business day.  For prescription refill requests, have your pharmacy contact our office.

## 2015-09-04 LAB — CEA: CEA: 5.1 ng/mL — AB (ref 0.0–4.7)

## 2015-09-06 ENCOUNTER — Encounter (HOSPITAL_COMMUNITY): Payer: Self-pay | Admitting: Lab

## 2015-09-06 NOTE — Progress Notes (Signed)
Referral sent to Dr Jenkins for port removal.  Records faxed on 1/30 

## 2015-09-28 NOTE — H&P (Signed)
  NTS SOAP Note  Vital Signs:  Vitals as of: 99991111: Systolic A999333: Diastolic 89: Heart Rate 73: Temp 60F (Temporal): Height 25ft 10.5in: Weight 190Lbs 0 Ounces: BMI 26.88   BMI : 26.88 kg/m2  Subjective: This 75 year old male presents for of portacath removal.  Is finished with chemotherapy.  Review of Symptoms:  Constitutional:unremarkable   Head:unremarkable Eyes:unremarkable   Nose/Mouth/Throat:unremarkable Cardiovascular:  unremarkable Respiratory:unremarkable Gastrointestinal:  unremarkable   Genitourinary:unremarkable   Musculoskeletal:unremarkable Skin:unremarkable Hematolgic/Lymphatic:unremarkable   Allergic/Immunologic:unremarkable   Past Medical History:  Reviewed  Past Medical History  Surgical History: colon resection, portacath placement, prostate surgery 2014 Medical Problems: prostate cancer, high cholersterol,  Allergies: tetanus Medications: baby asa, lovastatin, diclofenac   Social History:Reviewed  Social History  Preferred Language: English Race:  White Ethnicity: Not Hispanic / Latino Age: 75 year Marital Status:  M   Smoking Status: Current every day smoker reviewed on 09/28/2015 Started Date:  Packs per day: 1.00 Functional Status reviewed on 09/28/2015 ------------------------------------------------ Bathing: Normal Cooking: Normal Dressing: Normal Driving: Normal Eating: Normal Managing Meds: Normal Oral Care: Normal Shopping: Normal Toileting: Normal Transferring: Normal Walking: Normal Cognitive Status reviewed on 09/28/2015 ------------------------------------------------ Attention: Normal Decision Making: Normal Language: Normal Memory: Normal Motor: Normal Perception: Normal Problem Solving: Normal Visual and Spatial: Normal   Family History:Reviewed  Family Health History Family History is Unknown    Objective Information: General:Well appearing, well nourished in no  distress. Port in place left upper chest Heart:RRR, no murmur or gallop.  Normal S1, S2.  No S3, S4.  Lungs:  CTA bilaterally, no wheezes, rhonchi, rales.  Breathing unlabored.  Assessment:  Colon carcinoma, finished with chemotherapy  Diagnoses: V10.00  Z85.00 History of malignant neoplasm of gastrointestinal tract (Personal history of malignant neoplasm of unspecified digestive organ)  Procedures: [id number] - OFFICE OUTPATIENT NEW 20 MINUTES    Plan:  Scheduled for portacath removal in minor procedure room on 10/11/15.   Patient Education:Alternative treatments to surgery were discussed with patient (and family).  Risks and benefits  of procedure were fully explained to the patient (and family) who gave informed consent. Patient/family questions were addressed.  Follow-up:Pending Surgery

## 2015-10-11 ENCOUNTER — Encounter (HOSPITAL_COMMUNITY)
Admission: RE | Disposition: A | Discharge status: Home / Self Care | Payer: Self-pay | Source: Ambulatory Visit | Attending: General Surgery

## 2015-10-11 ENCOUNTER — Ambulatory Visit (HOSPITAL_COMMUNITY)
Admission: RE | Admit: 2015-10-11 | Discharge: 2015-10-11 | Disposition: A | Discharge status: Home / Self Care | Payer: Medicare Other | Source: Ambulatory Visit | Attending: General Surgery | Admitting: General Surgery

## 2015-10-11 ENCOUNTER — Encounter (HOSPITAL_COMMUNITY): Payer: Self-pay | Admitting: *Deleted

## 2015-10-11 DIAGNOSIS — Z452 Encounter for adjustment and management of vascular access device: Secondary | ICD-10-CM | POA: Insufficient documentation

## 2015-10-11 DIAGNOSIS — Z9221 Personal history of antineoplastic chemotherapy: Secondary | ICD-10-CM | POA: Diagnosis not present

## 2015-10-11 HISTORY — PX: PORT-A-CATH REMOVAL: SHX5289

## 2015-10-11 SURGERY — REMOVAL PORT-A-CATH
Anesthesia: LOCAL | Laterality: Left

## 2015-10-11 MED ORDER — LIDOCAINE HCL (PF) 1 % IJ SOLN
INTRAMUSCULAR | Status: DC | PRN
  Administered 2015-10-11: 4 mL

## 2015-10-11 MED ORDER — HYDROCODONE-ACETAMINOPHEN 5-325 MG PO TABS: 1.0000 | ORAL_TABLET | ORAL | Status: AC | PRN

## 2015-10-11 MED ORDER — LIDOCAINE HCL (PF) 1 % IJ SOLN
INTRAMUSCULAR | Status: AC
  Filled 2015-10-11: qty 30

## 2015-10-11 SURGICAL SUPPLY — 16 items
BAG HAMPER (MISCELLANEOUS) ×2 IMPLANT
CHLORAPREP W/TINT 10.5 ML (MISCELLANEOUS) ×2 IMPLANT
CLOTH BEACON ORANGE TIMEOUT ST (SAFETY) ×2 IMPLANT
DECANTER SPIKE VIAL GLASS SM (MISCELLANEOUS) ×2 IMPLANT
DERMABOND ADVANCED (GAUZE/BANDAGES/DRESSINGS) ×1
DERMABOND ADVANCED .7 DNX12 (GAUZE/BANDAGES/DRESSINGS) ×1 IMPLANT
DRAPE PROXIMA HALF (DRAPES) ×2 IMPLANT
ELECT REM PT RETURN 9FT ADLT (ELECTROSURGICAL) ×2
ELECTRODE REM PT RTRN 9FT ADLT (ELECTROSURGICAL) ×1 IMPLANT
GLOVE BIOGEL PI IND STRL 7.0 (GLOVE) ×1 IMPLANT
GLOVE BIOGEL PI INDICATOR 7.0 (GLOVE) ×1
GLOVE SURG SS PI 7.5 STRL IVOR (GLOVE) ×2 IMPLANT
GOWN STRL REUS W/TWL LRG LVL3 (GOWN DISPOSABLE) ×2 IMPLANT
SUT VIC AB 3-0 SH 27 (SUTURE) ×1
SUT VIC AB 3-0 SH 27X BRD (SUTURE) ×1 IMPLANT
SUT VIC AB 4-0 PS2 27 (SUTURE) ×2 IMPLANT

## 2015-10-11 NOTE — Op Note (Signed)
Patient:  HALVOR RILLEY  DOB:  12/04/1940  MRN:  [id number]   Preop Diagnosis:  Colon carcinoma, finished with chemotherapy  Postop Diagnosis:  Same  Procedure:  Port-A-Cath removal  Surgeon:  Aviva Signs, M.D.  Anes:  Local  Indications:  Patient is a 75 year old white male who presents for Port-A-Cath removal as he is finished chemotherapy for colon carcinoma. The risks and benefits of the procedure were fully explained to the patient, who gave informed consent.  Procedure note:  The patient was placed the supine position. The left upper chest was prepped and draped using usual sterile technique with DuraPrep. Surgical site confirmation was performed. 1% Xylocaine was used for local anesthesia.  Incision was made to the previous surgical incision site. The dissection was taken down to the port. The port was removed without difficulty. It was disposed of. The catheter tunnel was suture ligated using a 3-0 Vicryl interrupted suture. The subcutaneous layer was reapproximated using a 3-0 Vicryl interrupted suture. The skin was closed using a 4-0 Vicryl subcuticular suture. Dermabond was applied.  All tape and needle counts were correct the end of the procedure. Patient was discharged from the minor procedure room in stable condition.  Complications:  None  EBL:  Minimal  Specimen:  None

## 2015-10-11 NOTE — Interval H&P Note (Signed)
History and Physical Interval Note:  10/11/2015 8:24 AM  Roberto [name]  has presented today for surgery, with the diagnosis of colon cancer  The various methods of treatment have been discussed with the patient and family. After consideration of risks, benefits and other options for treatment, the patient has consented to  Procedure(s) with comments: REMOVAL PORT-A-CATH (Left) - pt to arrive at 7:55 - will do between cases as a surgical intervention .  The patient's history has been reviewed, patient examined, no change in status, stable for surgery.  I have reviewed the patient's chart and labs.  Questions were answered to the patient's satisfaction.     Aviva Signs A

## 2015-10-11 NOTE — Discharge Instructions (Signed)
Sit up for four hours.  Ice pack to wound as needed.

## 2015-10-13 ENCOUNTER — Encounter (HOSPITAL_COMMUNITY): Payer: Self-pay | Admitting: General Surgery

## 2015-10-29 ENCOUNTER — Encounter (HOSPITAL_COMMUNITY): Payer: Medicare Other

## 2016-04-01 IMAGING — CT CT CHEST W/ CM
2 of 4 series · 14 of 36 positions shown, 17 images · IV contrast (Omnipaque 300)
Comparison: CT 02/24/2013 and 02/22/2012. Whole body bone scan
02/25/2013.

CLINICAL DATA: Restaging colon cancer. History of partial colon
resection and chemotherapy in 8969. History of prostate cancer.

EXAM:
CT CHEST, ABDOMEN, AND PELVIS WITH CONTRAST
TECHNIQUE: Multidetector CT imaging of the chest, abdomen and pelvis was
performed following the standard protocol during bolus
administration of intravenous contrast.
CONTRAST:  100mL OMNIPAQUE IOHEXOL 300 MG/ML  SOLN

[Series 2: cap with 5.0 b40f · axial · 0.79mm/px · z∈[-758,-138]mm · 11 of 138 slices shown, 14 images]
[im 7/138  mediastinal]
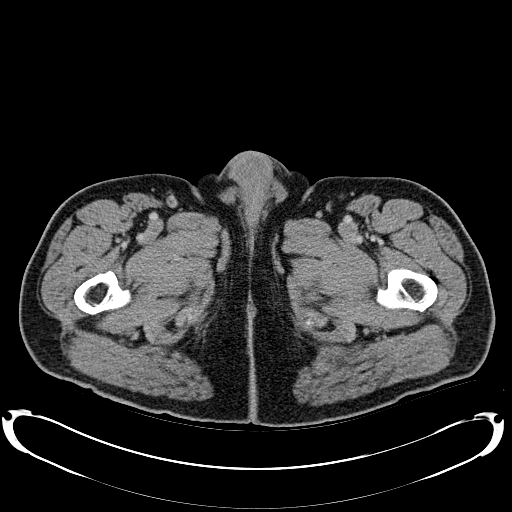
[im 7/138  lung]
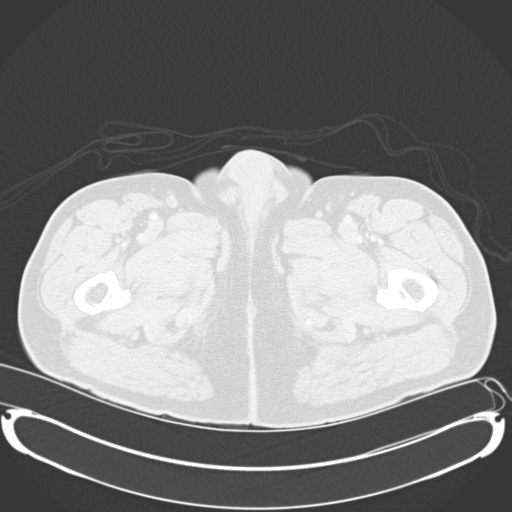
[im 21/138  lung]
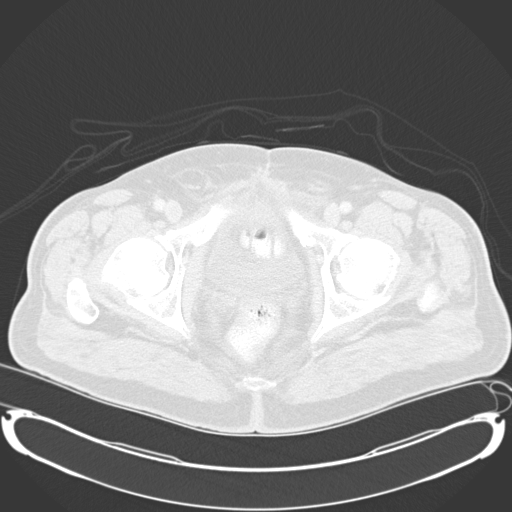
[im 35/138  lung]
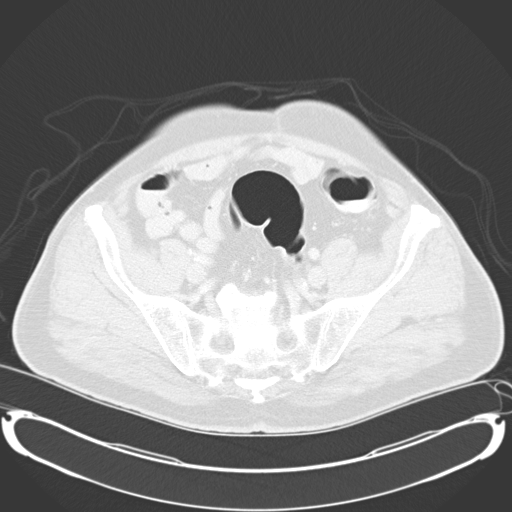
[im 48/138  lung]
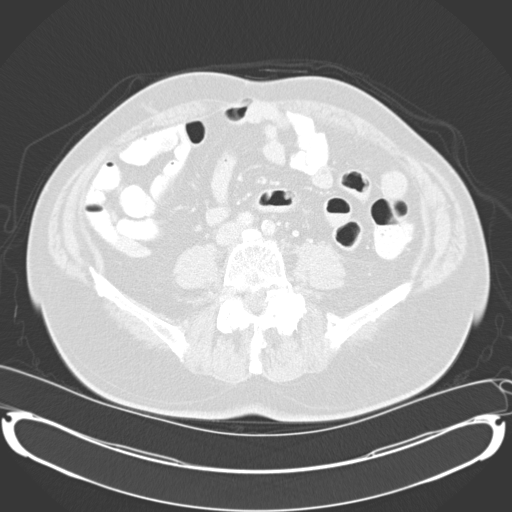
[im 55/138  mediastinal]
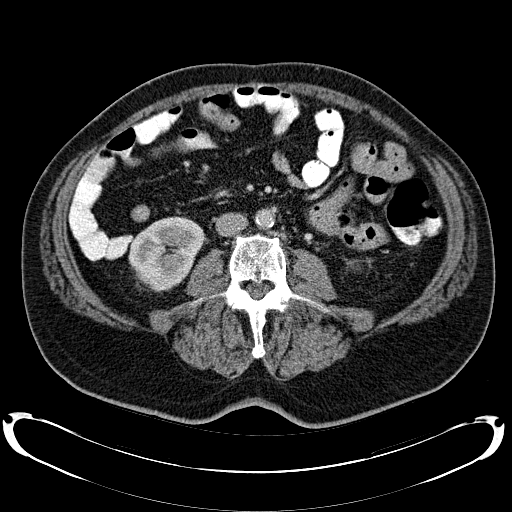
[im 55/138  lung]
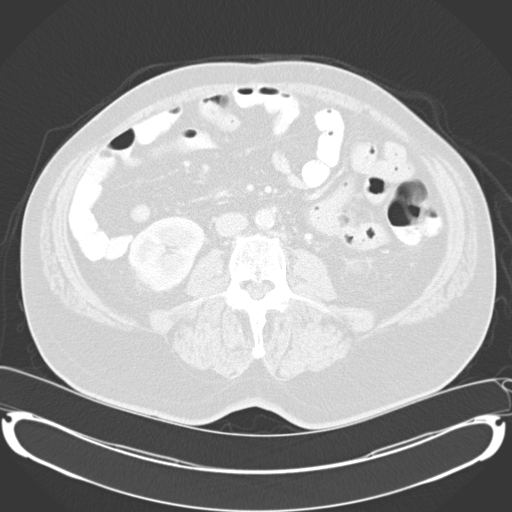
[im 69/138  lung]
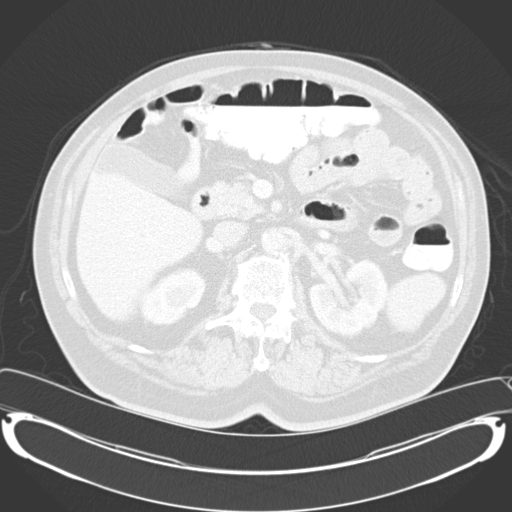
[im 83/138  lung]
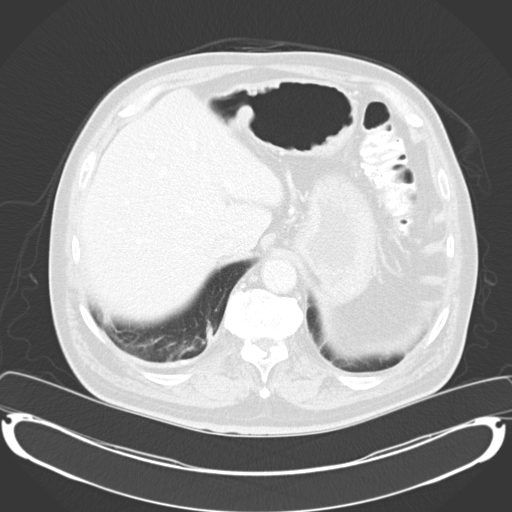
[im 90/138  lung]
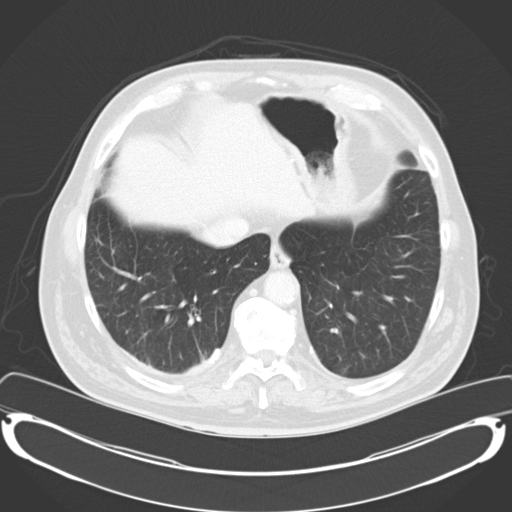
[im 103/138  mediastinal]
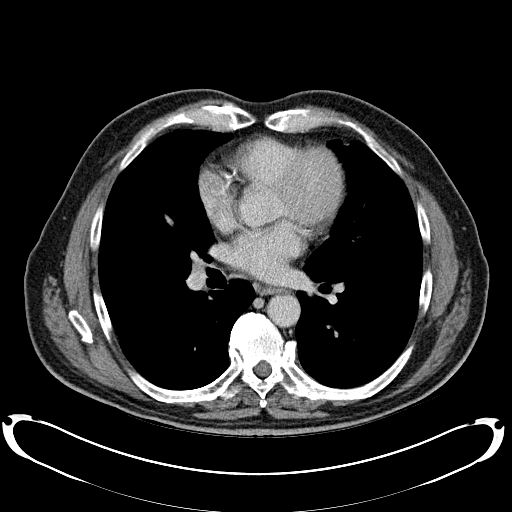
[im 103/138  lung]
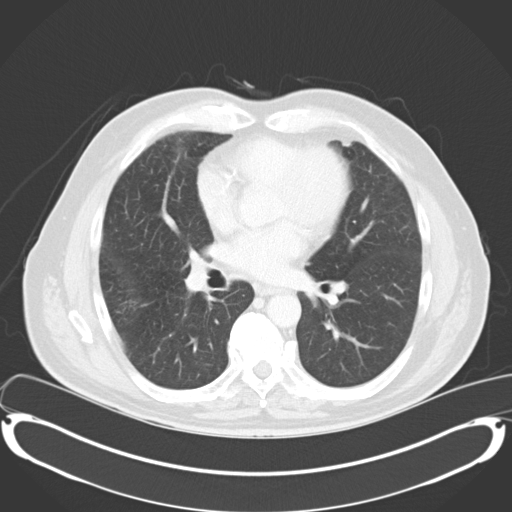
[im 117/138  lung]
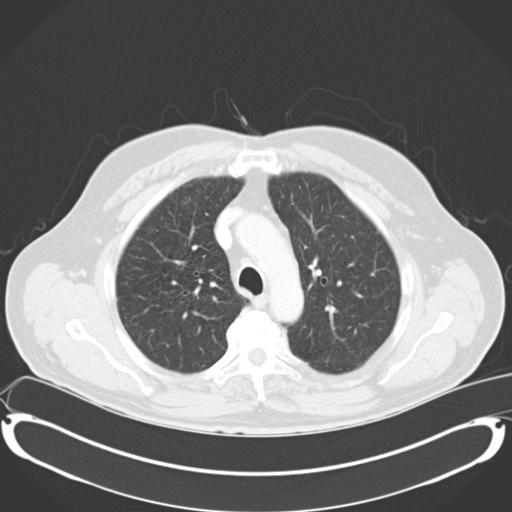
[im 131/138  lung]
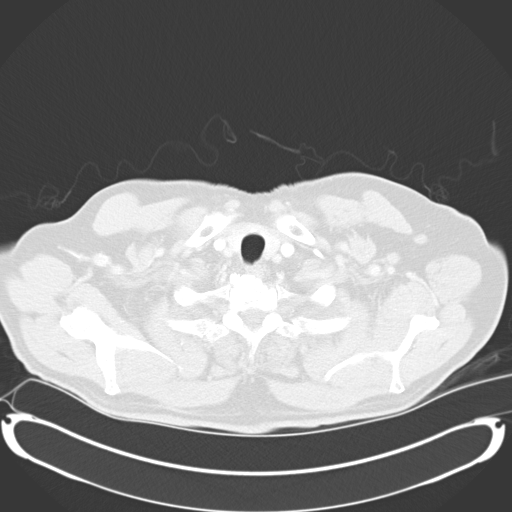

[Series 4: mpr cor post contrast (id) · coronal · 0.82mm/px · 3 of 109 slices shown]
[im 22/109  lung]
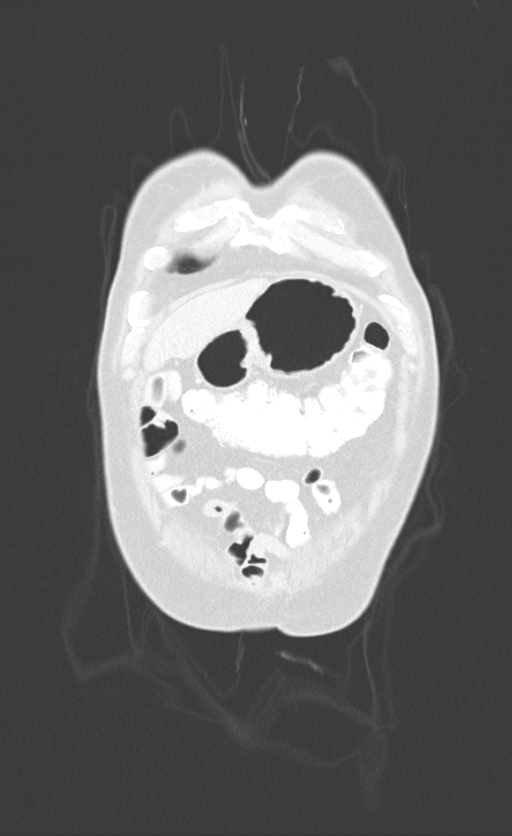
[im 44/109  lung]
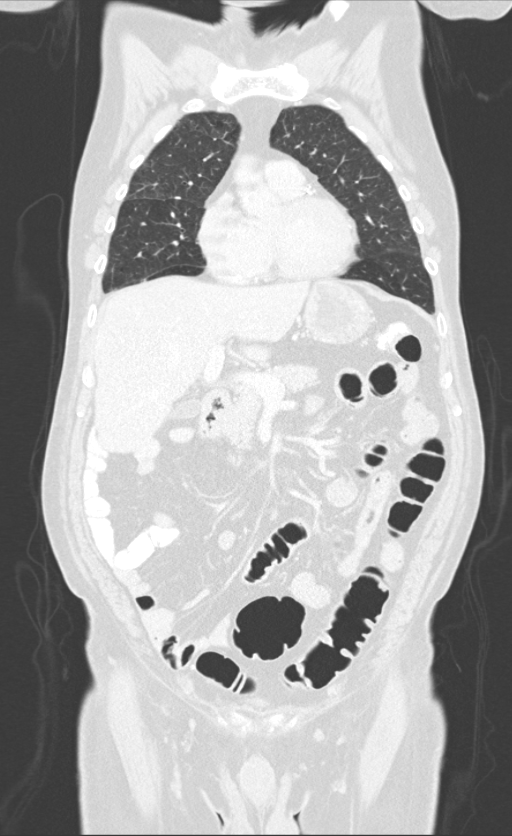
[im 65/109  lung]
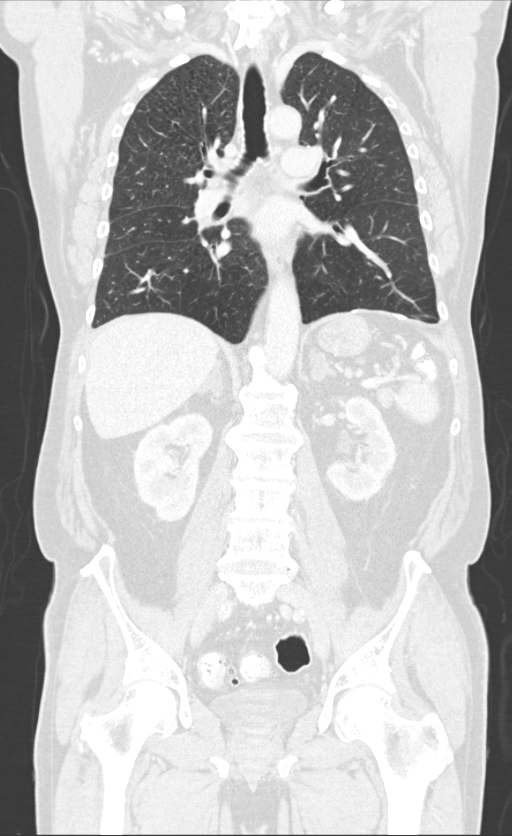

[14 of 36 positions shown; findings below may reference images not displayed]

FINDINGS: CT CHEST FINDINGS

Left subclavian Port-A-Cath tip is unchanged in the lower SVC. Small
mediastinal lymph nodes are stable, not pathologically enlarged.
There is stable atherosclerosis of the aorta, great vessels and
coronary arteries.

There is no significant pleural or pericardial effusion. The trachea
and thyroid gland appear unremarkable.

Again demonstrated is emphysema and scattered calcified pleural
plaque formation bilaterally. The 3 mm right upper lobe subpleural
nodule on image 19 is stable. No new or enlarging nodules are
identified.

CT ABDOMEN AND PELVIS FINDINGS

Subcapsular calcification inferiorly in the right hepatic lobe on
image 65 is stable. There are no new or enlarging liver lesions.
Ill-defined subcapsular low-density posteriorly in the spleen is
stable.

There is stable bilateral adrenal hyperplasia. The gallbladder,
pancreas and kidneys demonstrate no significant findings.

There are stable postsurgical changes following right hemicolectomy.
The anastomosis appears stable. There is no adenopathy, ascites or
peritoneal nodularity. The prostatectomy bed has a stable appearance
without recurrent mass. The bladder appears unremarkable, although
is nearly empty. There is stable atherosclerosis of the aorta, its
branches and iliac arteries. There is a stable small right inguinal
hernia containing fat.

There are stable mild degenerative changes throughout the spine. No
worrisome osseous findings are demonstrated.
IMPRESSION: 1. Overall stable examinations of the chest, abdomen and pelvis. No
evidence of metastatic colon or prostate cancer.
2. Sequela of asbestos exposure with calcified pleural plaques.
Small right apical nodule and emphysematous changes are stable.
3. Stable adrenal hyperplasia.

## 2016-09-04 ENCOUNTER — Other Ambulatory Visit (HOSPITAL_COMMUNITY): Payer: Self-pay | Admitting: *Deleted

## 2016-09-04 DIAGNOSIS — C61 Malignant neoplasm of prostate: Secondary | ICD-10-CM

## 2016-09-04 DIAGNOSIS — C189 Malignant neoplasm of colon, unspecified: Secondary | ICD-10-CM

## 2016-09-05 ENCOUNTER — Ambulatory Visit (HOSPITAL_COMMUNITY): Payer: Medicare Other | Admitting: Hematology & Oncology

## 2016-09-05 ENCOUNTER — Encounter (HOSPITAL_COMMUNITY): Payer: Medicare Other | Attending: Oncology | Admitting: Adult Health

## 2016-09-05 ENCOUNTER — Encounter (HOSPITAL_COMMUNITY): Payer: Medicare Other

## 2016-09-05 ENCOUNTER — Encounter (HOSPITAL_COMMUNITY): Payer: Self-pay | Admitting: Adult Health

## 2016-09-05 ENCOUNTER — Other Ambulatory Visit (HOSPITAL_COMMUNITY): Payer: Medicare Other

## 2016-09-05 VITALS — BP 142/72 | HR 75 | Temp 98.1°F | Resp 16 | Wt 190.7 lb

## 2016-09-05 DIAGNOSIS — C189 Malignant neoplasm of colon, unspecified: Secondary | ICD-10-CM

## 2016-09-05 DIAGNOSIS — C61 Malignant neoplasm of prostate: Secondary | ICD-10-CM | POA: Diagnosis present

## 2016-09-05 DIAGNOSIS — Z8546 Personal history of malignant neoplasm of prostate: Secondary | ICD-10-CM

## 2016-09-05 DIAGNOSIS — F17200 Nicotine dependence, unspecified, uncomplicated: Secondary | ICD-10-CM

## 2016-09-05 DIAGNOSIS — R9721 Rising PSA following treatment for malignant neoplasm of prostate: Secondary | ICD-10-CM

## 2016-09-05 DIAGNOSIS — Z720 Tobacco use: Secondary | ICD-10-CM | POA: Diagnosis not present

## 2016-09-05 DIAGNOSIS — Z85038 Personal history of other malignant neoplasm of large intestine: Secondary | ICD-10-CM | POA: Diagnosis not present

## 2016-09-05 LAB — CBC WITH DIFFERENTIAL/PLATELET
Basophils Absolute: 0 10*3/uL (ref 0.0–0.1)
Basophils Relative: 0 %
Eosinophils Absolute: 0.2 10*3/uL (ref 0.0–0.7)
Eosinophils Relative: 2 %
HEMATOCRIT: 48.4 % (ref 39.0–52.0)
HEMOGLOBIN: 15.7 g/dL (ref 13.0–17.0)
LYMPHS ABS: 3.1 10*3/uL (ref 0.7–4.0)
LYMPHS PCT: 29 %
MCH: 29.2 pg (ref 26.0–34.0)
MCHC: 32.4 g/dL (ref 30.0–36.0)
MCV: 90.1 fL (ref 78.0–100.0)
MONOS PCT: 10 %
Monocytes Absolute: 1 10*3/uL (ref 0.1–1.0)
NEUTROS ABS: 6.4 10*3/uL (ref 1.7–7.7)
NEUTROS PCT: 59 %
Platelets: 295 10*3/uL (ref 150–400)
RBC: 5.37 MIL/uL (ref 4.22–5.81)
RDW: 13.7 % (ref 11.5–15.5)
WBC: 10.7 10*3/uL — ABNORMAL HIGH (ref 4.0–10.5)

## 2016-09-05 LAB — COMPREHENSIVE METABOLIC PANEL
ALBUMIN: 4 g/dL (ref 3.5–5.0)
ALK PHOS: 84 U/L (ref 38–126)
ALT: 11 U/L — ABNORMAL LOW (ref 17–63)
ANION GAP: 7 (ref 5–15)
AST: 14 U/L — AB (ref 15–41)
BILIRUBIN TOTAL: 0.6 mg/dL (ref 0.3–1.2)
BUN: 17 mg/dL (ref 6–20)
CALCIUM: 9.6 mg/dL (ref 8.9–10.3)
CO2: 29 mmol/L (ref 22–32)
Chloride: 102 mmol/L (ref 101–111)
Creatinine, Ser: 1.05 mg/dL (ref 0.61–1.24)
GFR calc Af Amer: >60 mL/min (ref >60)
GLUCOSE: 119 mg/dL — AB (ref 65–99)
Potassium: 4.9 mmol/L (ref 3.5–5.1)
Sodium: 138 mmol/L (ref 135–145)
TOTAL PROTEIN: 7.3 g/dL (ref 6.5–8.1)

## 2016-09-05 NOTE — Patient Instructions (Addendum)
[place] at [facility] Discharge Instructions  RECOMMENDATIONS MADE BY THE CONSULTANT AND ANY TEST RESULTS WILL BE SENT TO YOUR REFERRING PHYSICIAN.  Exam with Mike Craze, NP.   Return in 6 months with labs.    Thank you for choosing [place] at [facility] to provide your oncology and hematology care.  To afford each patient quality time with our provider, please arrive at least 15 minutes before your scheduled appointment time.    If you have a lab appointment with the [facility] please come in thru the  Main Entrance and check in at the main information desk  You need to re-schedule your appointment should you arrive 10 or more minutes late.  We strive to give you quality time with our providers, and arriving late affects you and other patients whose appointments are after yours.  Also, if you no show three or more times for appointments you may be dismissed from the clinic at the providers discretion.     Again, thank you for choosing [facility].  Our hope is that these requests will decrease the amount of time that you wait before being seen by our physicians.       _____________________________________________________________  Should you have questions after your visit to [facility], please contact our office at [phone] between the hours of 8:30 a.m. and 4:30 p.m.  Voicemails left after 4:30 p.m. will not be returned until the following business day.  For prescription refill requests, have your pharmacy contact our office.       Resources For Cancer Patients and their Caregivers ? American Cancer Society: Can assist with transportation, wigs, general needs, runs Look Good Feel Better.        406-666-6265 ? Cancer Care: Provides financial assistance, online support groups, medication/co-pay assistance.  1-800-813-HOPE (270) 589-9269) ? [facility] Assists [place]  cancer patients and their families through emotional , educational and financial support.  703-559-8886 ? [place] DSS Where to apply for food stamps, Medicaid and utility assistance. (364)295-5420 ? [facility]: Transportation to medical appointments. 2096259632 ? Social Security Administration: May apply for disability if have a Stage IV cancer. 785-026-5720 508-840-3345 ? [employer], Disability and Transit Services: Assists with nutrition, care and transit needs. [place] Support Programs: @10RELATIVEDAYS @ > Cancer Support Group  2nd Tuesday of the month 1pm-2pm, Journey Room  > Creative Journey  3rd Tuesday of the month 1130am-1pm, Journey Room  > Look Good Feel Better  1st Wednesday of the month 10am-12 noon, Journey Room (Call [facility] to register [phone])

## 2016-09-05 NOTE — Pre-Procedure Instructions (Signed)
PSA (outside records, [facility])

## 2016-09-05 NOTE — Progress Notes (Signed)
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 2. Prostate cancer status post radical prostatectomy in October 2004, low PSA velocity..  3. 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  4. Port-A-Cath in place and working well . 5. Pulmonary asbestosis.   INTERVAL HISTORY: Roberto [name] 76 y.o. 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  Roberto [name] is here alone today for follow-up.  When asked how he has been feeling, he jokingly states that he feels "pretty well for an old man."  He has brought a copy of his PSA labs for me to review; he wants to get my recommendations on what he should do.  Per patient, his urology team [facility]-Laurie Wilmoth, NP) has told him that he would not need any additional treatment for his increasing PSA until PSA value rose to above 2 ng/mL.  His most recent PSA was done on 08/28/16 and was 1.98.  He denies any hematuria or dysuria. Reports nocturia 1-2 times/night, which has been stable since his prostatectomy in 2004.  Denies any new bone pain. Denies cough or shortness of breath.  He shared with me that he did meet with Dr. Meda [name], a radiation oncologist in [place], about 2 years ago.  The patient is "not too keen" on considering radiation therapy right now.  He tells me he  has had several friends with prostate cancer who underwent radiation therapy and experienced severe side effects. He has never received androgen deprivation therapy. His biggest concern is caring for his sick wife, who is wheelchair bound and on oxygen.    With regards to his h/o colon cancer, his last colonoscopy was in 11/2014 and showed 1 hyperplastic polyp; they recommended he return in 3 years for repeat exam.  He denies any diarrhea, constipation, frank blood in his stools, or melena.  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  Denies abdominal pain, nausea, or vomiting.   He did have his Port-A-Cath removed on 10/11/15.  He continues to smoke about 1 ppd. He started smoking at the age of 79 (about 10 years total). He tells me he has cut back in recent years on the amount he smokes. "It is just automatic now. Sometimes I smoke and I don't even really want a cigarette, but I still do it."     REVIEW OF SYSTEMS:  Review of Systems  Constitutional: Negative.  Negative for appetite change, chills, fatigue and fever.  HENT:  Negative.   Eyes: Negative.   Respiratory: Negative.  Negative for cough and shortness of breath.   Cardiovascular: Negative.   Gastrointestinal: Negative.  Negative for abdominal pain, blood in stool, constipation, diarrhea, nausea and vomiting.  Endocrine: Negative.   Genitourinary: Positive for nocturia. Negative for difficulty urinating, hematuria and pelvic pain.  Musculoskeletal: Negative.  Negative for arthralgias, back pain and neck pain.  Neurological: Negative.  Negative for dizziness and headaches.  Hematological: Negative.   Psychiatric/Behavioral: Negative.      PAST MEDICAL/SURGICAL HISTORY: Past Medical History:  Diagnosis Date  . 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 Colon cancer ([place])    colon 2010/surg and chemo  . Hypercholesteremia   . Port-a-cath in place 04/02/2013  .  Prostate cancer ([place])    prostate 2004/surg  . Varicose veins of lower limb 1985   rt. leg   SURGICAL HISTORY: Past Surgical History:  Procedure Laterality Date  . COLON SURGERY    . COLONOSCOPY N/A 11/11/2014   Procedure: COLONOSCOPY;  Surgeon: Rogene Houston, MD;  Location: [facility];  Service: Endoscopy;  Laterality: N/A;  830  . HAND TENDON SURGERY     lt. hand repair  . PORT-A-CATH REMOVAL Left 10/11/2015   Procedure: REMOVAL PORT-A-CATH LEFT CHEST;  Surgeon: Aviva Signs, MD;  Location: [facility] ORS;  Service: General;  Laterality: Left;  . PROSTATE SURGERY    . STYLOID PROCESS EXCISION    . VARICOSE VEIN SURGERY  1980's    CURRENT MEDICATIONS:  Outpatient Encounter Prescriptions as of 09/05/2016  Medication Sig  . acetaminophen (TYLENOL) 325 MG tablet Take 650 mg by mouth every 6 (six) hours as needed.  [name] aspirin 81 MG tablet Take 81 mg by mouth daily.    [name] lovastatin (MEVACOR) 20 MG tablet Take 20 mg by mouth daily.  . diclofenac (CATAFLAM) 50 MG tablet Take 50 mg by mouth daily.   [name] HYDROcodone-acetaminophen (NORCO/VICODIN) 5-325 MG tablet Take 1 tablet by mouth every 4 (four) hours as needed for moderate pain. (Patient not taking: Reported on 09/05/2016)  . [DISCONTINUED] lidocaine-prilocaine (EMLA) cream Apply a quarter size amount to port site 1 hour prior to appointment. Do not rub in. Cover with plastic wrap.   No facility-administered encounter medications on file as of 09/05/2016.    ALLERGIES:  Allergies  Allergen Reactions  . Tetanus Toxoids Swelling    Arm became very swollen after injection (occurred approx. 40 years ago.)     SOCIAL HISTORY: Social History   Social History  . Marital status: Married    Spouse name: N/A  . Number of children: N/A  . Years of education: N/A   Occupational History  . Not on file.   Social History Main Topics  . Smoking status: Current Every Day Smoker    Packs/day: 1.00    Years: 51.00    Types: Cigarettes  .  Smokeless tobacco: Never Used  . Alcohol use No  . Drug use: No  . Sexual activity: Not on file   Other Topics Concern  . Not on file   Social History Narrative  . No narrative on file    FAMILY HISTORY: Family History  Problem Relation Age of Onset  . Cancer Father     PHYSICAL EXAMINATION  ECOG PERFORMANCE STATUS: 0 - Asymptomatic  Vitals:   09/05/16 1028  BP: (!) 142/72  Pulse: 75  Resp: 16  Temp: 98.1 F (36.7 C)   Filed Weights   09/05/16 1028  Weight: 190 lb 11.2 oz (86.5 kg)   Physical Exam  Constitutional: He is oriented to person, place, and time and well-developed, well-nourished, and in no distress.  HENT:  Head: Normocephalic.  Mouth/Throat: No oropharyngeal exudate.  Eyes: Pupils are equal, round, and reactive to light. No scleral icterus.  Mildly  erythematous conjunctiva; mild bilateral epiphora.   Neck: Normal range of motion. Neck supple.  Cardiovascular: Normal rate and regular rhythm.   Pulmonary/Chest: Effort normal.  Diminished breath sounds at bases, otherwise clear to auscultation bilaterally.   Abdominal: Soft. Bowel sounds are normal. He exhibits no distension and no mass. There is no tenderness. There is no rebound and no guarding.  Musculoskeletal: Normal range of motion. He exhibits no edema.  Lymphadenopathy:    He has no cervical adenopathy.  Neurological: He is alert and oriented to person, place, and time. No cranial nerve deficit. Gait normal.  Skin: Skin is warm and dry. No rash noted.  Psychiatric: Mood, memory, affect and judgment normal.     LABORATORY DATA: I have reviewed the data as listed. CBC    Component Value Date/Time   WBC 10.7 (H) 09/05/2016 1007   RBC 5.37 09/05/2016 1007   HGB 15.7 09/05/2016 1007   HCT 48.4 09/05/2016 1007   PLT 295 09/05/2016 1007   MCV 90.1 09/05/2016 1007   MCH 29.2 09/05/2016 1007   MCHC 32.4 09/05/2016 1007   RDW 13.7 09/05/2016 1007   LYMPHSABS 3.1 09/05/2016 1007   MONOABS  1.0 09/05/2016 1007   EOSABS 0.2 09/05/2016 1007   BASOSABS 0.0 09/05/2016 1007   CMP     Component Value Date/Time   NA 138 09/05/2016 1007   K 4.9 09/05/2016 1007   CL 102 09/05/2016 1007   CO2 29 09/05/2016 1007   GLUCOSE 119 (H) 09/05/2016 1007   BUN 17 09/05/2016 1007   CREATININE 1.05 09/05/2016 1007   CALCIUM 9.6 09/05/2016 1007   PROT 7.3 09/05/2016 1007   ALBUMIN 4.0 09/05/2016 1007   AST 14 (L) 09/05/2016 1007   ALT 11 (L) 09/05/2016 1007   ALKPHOS 84 09/05/2016 1007   BILITOT 0.6 09/05/2016 1007   GFRNONAA >60 09/05/2016 1007   GFRAA >60 09/05/2016 1007   Results for NAASIR, CARREIRA (MRN [id number])  Ref. Range 08/31/2014 13:21 08/31/2014 13:52 11/20/2014 14:20 02/15/2015 13:15 03/19/2015 12:10 09/03/2015 14:45  CEA Latest Ref Range: 0.0 - 4.7 ng/mL 5.7 (H)  6.2 (H) 6.0 (H) 5.9 (H) 5.1 (H)   PSA Results from [facility]     RADIOLOGY: I have personally reviewed the radiological reports as listed.  CT chest/abd/pelvis: 09/16/14    Bone scan: 09/16/14       ASSESSMENT and THERAPY PLAN:   1. 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 2. Prostate cancer status post radical prostatectomy in October 2004, low PSA velocity.  3. 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  4. Pulmonary asbestosis. 5. Persistently elevated CEA, patient is a smoker   Prostate cancer, rising PSA likely indicative of biochemical recurrence:  -Mr. Rickerson completed treatment for prostate cancer in 05/2003 with radical prostatectomy alone.  His urologist specialists in [place], [place] have been monitoring his PSA values over the past several years.  PSA has been progressively rising for the past several years.  Most recent PSA 1.98 on 08/28/16. He has previously met with Dr.  Meda [name], radiation oncologist, in [place], [place] a few years ago for consideration of radiation therapy.  I shared with him my concerns for his rising PSA; although the increases have been gradual over several years, this is highly suggestive of at  least biochemical recurrence of prostate cancer.  We discussed that there are several salvage treatments that could possible be available to him including radiation therapy, androgen-deprivation therapy, or chemotherapy.  To better determine his treatment options, we would need to know the current extent of his disease with repeat imaging with CT and bone scan.  I shared with him that based on his increased PSA after prostatectomy, my recommendation would be to proceed with restaging imaging and then make appropriate treatment plans based on those results.  He declines imaging at this time.  I reinforced my rationale for not wanting to wait to get imaging d/t risk for metastatic disease, but he again declines stating that he has a lot to worry about right now in the care of his ill wife.  He prefers to wait until his PSA is >2 before proceeding with any further work-up.  Fortunately, he did have CT chest/abdomen/pelvis in 09/2014 which was negative for metastatic disease; he also had bone scan in 09/2014 which was also negative for osseous metastatic disease.  Therefore, his rising PSA could be secondary to loco-regional recurrence of prostate cancer.  He did agree to return to the cancer center in 6 months for repeat PSA and follow-up visit.     Stage IIIB cancer of ascending colon: -No evidence of recurrence on physical exam.  He is now 6.5 years out from his original diagnosis without evidence of disease, which is very favorable.  His last CT abdomen and pelvis was done on 09/16/14 and was negative for metastatic disease.  He is up-to-date with his colonoscopy evaluations; last colonoscopy was done on 11/11/14 and revealed hyperplastic polyp; patient will be due for his  next colonoscopy in 11/2017 per Dr. Laural Golden. His CEA remains elevated, likely secondary to current tobacco use.     Tobacco use disorder:  -We discussed the pathophysiology of nicotine dependence and different strategies to stop smoking. The gold standard of tobacco cessation is nicotine replacement (with nicotine patches & gum/lozenges) or Varenicline (Chantix).  We also talked about Wellbutrin XL as a viable option for him as well, as he may have some depression as well.  We discussed that the typical craving/urge to smoke lasts about 3-5 minutes. He is unable to identify specific triggers to smoke; "it's just a habit now."  Encouraged him to set some new "rules", in which he "rations" his cigarettes for the day. (i.e. Instead of smoking 20 cigarettes/day, remove 5 cigarettes from the pack before putting them in his pocket for the day to see if he can decrease his daily use gradually over time).  We discussed the use of a piece of nicotine gum or a lozenge when she has a craving. I gave him instructions on how to use these nicotine replacement products.  RobertoPresutti  understands that data suggests that "cold [place]" is the least effective way to stop using tobacco products.  Having a clear "quit plan" is much more effective and requires a step-wise approach with continued support from a tobacco treatment specialist.  I encouraged him to reach out to me if he has further questions or interest in his continued cessation efforts.  Greater than 5 minutes was spent in smoking cessation counseling with this patient.       Dispo:  -Return to cancer center in 6 months to recheck PSA.  -Continue smoking cessation efforts.    All questions were answered. The patient knows to call the clinic with any problems, questions or concerns. We can  certainly see the patient much sooner if necessary.   A total of 40 minutes was spent in face-to-face care of this patient with greater than 50% of that time spent in counseling  and care-coordination.    Mike Craze, NP [place] [phone]

## 2016-09-06 LAB — CEA: CEA: 6.3 ng/mL — AB (ref 0.0–4.7)

## 2017-03-05 ENCOUNTER — Encounter (HOSPITAL_BASED_OUTPATIENT_CLINIC_OR_DEPARTMENT_OTHER): Payer: Medicare Other | Admitting: Oncology

## 2017-03-05 ENCOUNTER — Encounter (HOSPITAL_COMMUNITY): Payer: Self-pay | Admitting: Oncology

## 2017-03-05 ENCOUNTER — Encounter (HOSPITAL_COMMUNITY): Payer: Medicare Other | Attending: Oncology

## 2017-03-05 ENCOUNTER — Other Ambulatory Visit (HOSPITAL_COMMUNITY): Payer: Medicare Other

## 2017-03-05 ENCOUNTER — Ambulatory Visit (HOSPITAL_COMMUNITY): Payer: Medicare Other | Admitting: Oncology

## 2017-03-05 VITALS — BP 132/64 | HR 68 | Temp 98.9°F | Resp 16 | Wt 184.0 lb

## 2017-03-05 DIAGNOSIS — Z85038 Personal history of other malignant neoplasm of large intestine: Secondary | ICD-10-CM

## 2017-03-05 DIAGNOSIS — E7800 Pure hypercholesterolemia, unspecified: Secondary | ICD-10-CM | POA: Diagnosis not present

## 2017-03-05 DIAGNOSIS — M899 Disorder of bone, unspecified: Secondary | ICD-10-CM | POA: Diagnosis not present

## 2017-03-05 DIAGNOSIS — Z9889 Other specified postprocedural states: Secondary | ICD-10-CM | POA: Insufficient documentation

## 2017-03-05 DIAGNOSIS — Z720 Tobacco use: Secondary | ICD-10-CM

## 2017-03-05 DIAGNOSIS — F17210 Nicotine dependence, cigarettes, uncomplicated: Secondary | ICD-10-CM | POA: Diagnosis not present

## 2017-03-05 DIAGNOSIS — Z8546 Personal history of malignant neoplasm of prostate: Secondary | ICD-10-CM | POA: Diagnosis present

## 2017-03-05 DIAGNOSIS — R9721 Rising PSA following treatment for malignant neoplasm of prostate: Secondary | ICD-10-CM

## 2017-03-05 DIAGNOSIS — C61 Malignant neoplasm of prostate: Secondary | ICD-10-CM

## 2017-03-05 DIAGNOSIS — C182 Malignant neoplasm of ascending colon: Secondary | ICD-10-CM

## 2017-03-05 LAB — PSA: PROSTATIC SPECIFIC ANTIGEN: 2.63 ng/mL (ref 0.00–4.00)

## 2017-03-05 NOTE — Assessment & Plan Note (Signed)
Prostate cancer (05/2003), S/P radical prostatectomy in October 2014.  Now with slowly rising PSA concerning for biochemical recurrence.  Imaging in 2016 was negative for metastatic disease.  His urologist is in [place], [place].  He has met with Dr. Meda Coffee, Rad Onc, in [place], [place] in the past for consideration of XRT.  Labs today: PSA.

## 2017-03-05 NOTE — Assessment & Plan Note (Addendum)
Stage IIIB cancer of ascending colon ([id number]), grade 2, with definitive surgery on 02/22/2009 followed by XELOX x 6 cycles finishing all therapy on 07/21/2009.  He has completed surveillance in accordance with NCCN guidelines.  No role for labs from an oncology perspective.  Labs in 12 months: CBC diff, CMET, CEA.  CEA remains elevated but WNL for a smoker.  Next colonoscopy is due in 11/2017 by Dr. Laural Golden.  I personally reviewed and went over radiographic studies with the patient.  The results are noted within this dictation.  I personally reviewed the images in PACS.  Last CT imaging in 2016 was negative for recurrence of disease.  Return in 1 year for follow-up, sooner if needed.

## 2017-03-05 NOTE — Progress Notes (Signed)
Roberto Mallet, MD [place] [address]  Malignant neoplasm of ascending colon [facility]) - Plan: CBC with Differential, Comprehensive metabolic panel, CEA  Prostate cancer ([place]) - Plan: PSA, CANCELED: PSA  CURRENT THERAPY: Surveillance per NCCN guidelines  INTERVAL HISTORY: Roberto [name] 76 y.o. male returns for followup of H/O Stage IIIB cancer of ascending colon ([id number]), grade 2, with definitive surgery on 02/22/2009 followed by XELOX x 6 cycles finishing all therapy on 07/21/2009. AND Prostate cancer (05/2003), S/P radical prostatectomy in October 2014 AND Pulmonary asbestosis  HPI Elements   Location: Ascending colon  Quality: Adenocarcinoma  Severity: Stage IIIB  Duration: Definitive surgery on 02/22/2009  Context: S/P XELOX x 6 cycles finishing on 07/21/2009  Timing:   Modifying Factors:   Associated Signs & Symptoms:     Review of Systems  Constitutional: Negative.  Negative for chills, fever and weight loss.  HENT: Negative.   Eyes: Negative.   Respiratory: Negative.  Negative for cough.   Cardiovascular: Negative.  Negative for chest pain.  Gastrointestinal: Negative.  Negative for blood in stool, constipation, diarrhea, melena, nausea and vomiting.  Genitourinary: Negative.   Musculoskeletal: Negative.   Skin: Negative.   Neurological: Negative.  Negative for weakness.  Endo/Heme/Allergies: Negative.   Psychiatric/Behavioral: Negative.     Past Medical History:  Diagnosis Date  . 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 Colon cancer ([place])    colon 2010/surg and chemo  . Hypercholesteremia   . Port-a-cath in place 04/02/2013  . Prostate cancer ([place])    prostate 2004/surg  . Varicose veins of lower limb 1985   rt. leg    Past Surgical History:  Procedure Laterality Date  . COLON SURGERY    . COLONOSCOPY N/A 11/11/2014   Procedure: COLONOSCOPY;  Surgeon: Rogene Houston, MD;  Location[facility];  Service: Endoscopy;   Laterality: N/A;  830  . HAND TENDON SURGERY     lt. hand repair  . PORT-A-CATH REMOVAL Left 10/11/2015   Procedure: REMOVAL PORT-A-CATH LEFT CHEST;  Surgeon: Aviva Signs, MD;  Location: [facility] ORS;  Service: General;  Laterality: Left;  . PROSTATE SURGERY    . STYLOID PROCESS EXCISION    . VARICOSE VEIN SURGERY  1980's    Family History  Problem Relation Age of Onset  . Cancer Father     Social History   Social History  . Marital status: Married    Spouse name: N/A  . Number of children: N/A  . Years of education: N/A   Social History Main Topics  . Smoking status: Current Every Day Smoker    Packs/day: 1.00    Years: 51.00    Types: Cigarettes  . Smokeless tobacco: Never Used  . Alcohol use No  . Drug use: No  . Sexual activity: Not Asked   Other Topics Concern  . None   Social History Narrative  . None     PHYSICAL EXAMINATION  ECOG PERFORMANCE STATUS: 0 - Asymptomatic  Vitals:   03/05/17 1046  BP: 132/64  Pulse: 68  Resp: 16  Temp: 98.9 F (37.2 C)    GENERAL:alert, no distress, well nourished, well developed, comfortable, cooperative, smiling. SKIN: skin color, texture, turgor are normal, no rashes or significant lesions HEAD: Normocephalic, No masses, lesions, tenderness or abnormalities EYES: normal, EOMI EARS: External ears normal OROPHARYNX:lips, buccal mucosa, and tongue normal  NECK: supple, trachea midline LYMPH:  not examined BREAST:not examined  LUNGS: clear to auscultation  HEART: regular rate & rhythm ABDOMEN:abdomen soft and normal bowel sounds BACK: Back symmetric, no curvature. EXTREMITIES:less then 2 second capillary refill, no joint deformities, effusion, or inflammation, no skin discoloration, no cyanosis  NEURO: alert & oriented x 3 with fluent speech, no focal motor/sensory deficits, gait normal   LABORATORY DATA: CBC    Component Value Date/Time   WBC 10.7 (H) 09/05/2016 1007   RBC 5.37 09/05/2016 1007   HGB 15.7  09/05/2016 1007   HCT 48.4 09/05/2016 1007   PLT 295 09/05/2016 1007   MCV 90.1 09/05/2016 1007   MCH 29.2 09/05/2016 1007   MCHC 32.4 09/05/2016 1007   RDW 13.7 09/05/2016 1007   LYMPHSABS 3.1 09/05/2016 1007   MONOABS 1.0 09/05/2016 1007   EOSABS 0.2 09/05/2016 1007   BASOSABS 0.0 09/05/2016 1007      Chemistry      Component Value Date/Time   NA 138 09/05/2016 1007   K 4.9 09/05/2016 1007   CL 102 09/05/2016 1007   CO2 29 09/05/2016 1007   BUN 17 09/05/2016 1007   CREATININE 1.05 09/05/2016 1007      Component Value Date/Time   CALCIUM 9.6 09/05/2016 1007   ALKPHOS 84 09/05/2016 1007   AST 14 (L) 09/05/2016 1007   ALT 11 (L) 09/05/2016 1007   BILITOT 0.6 09/05/2016 1007     Lab Results  Component Value Date   CEA 6.3 (H) 09/05/2016   Lab Results  Component Value Date   PSA 1.50 03/19/2015   PSA 1.42 08/31/2014   PSA 1.42 06/02/2014      PENDING LABS:   RADIOGRAPHIC STUDIES:  No results found.   PATHOLOGY:    ASSESSMENT AND PLAN:  Colon cancer ([place]) Stage IIIB cancer of ascending colon ([id number]), grade 2, with definitive surgery on 02/22/2009 followed by XELOX x 6 cycles finishing all therapy on 07/21/2009.  He has completed surveillance in accordance with NCCN guidelines.  No role for labs from an oncology perspective.  Labs in 12 months: CBC diff, CMET, CEA.  CEA remains elevated but WNL for a smoker.  Next colonoscopy is due in 11/2017 by Dr. Laural Golden.  I personally reviewed and went over radiographic studies with the patient.  The results are noted within this dictation.  I personally reviewed the images in PACS.  Last CT imaging in 2016 was negative for recurrence of disease.  Return in 1 year for follow-up, sooner if needed.  Prostate cancer [facility]) Prostate cancer (05/2003), S/P radical prostatectomy in October 2014.  Now with slowly rising PSA concerning for biochemical recurrence.  Imaging in 2016 was negative for metastatic disease.   His urologist is in [place], [place].  He has met with Dr. Meda Coffee, Rad Onc, in [place], [place] in the past for consideration of XRT.  Labs today: PSA.   ORDERS PLACED FOR THIS ENCOUNTER: Orders Placed This Encounter  Procedures  . CBC with Differential  . Comprehensive metabolic panel  . CEA  . PSA    MEDICATIONS PRESCRIBED THIS ENCOUNTER: Meds ordered this encounter  Medications  . lovastatin (MEVACOR) 40 MG tablet    Sig: Take by mouth.    THERAPY PLAN:  NCCN guidelines for surveillance for Colon cancer are as follows (2.2018):  A. Stage I   1. Colonoscopy at year 1    A. If advanced adenoma, repeat in 1 year    B. If no advanced adenoma, repeat in 3 years, and then every 5 years.  B. Stage II, Stage III   1. H+P every 3-6 months x 2 years and then every 6 months for a total of 5 years    2. CEA every 3-6 months x 2 years and then every 6 months for a total of 5 years    3. CT CAP every 6-12 months (category 2B for frequency < 12 months) for a total of 5 years .   4.  Colonoscopy in 1 year except if no preoperative colonoscopy due to obstructing lesion, colonoscopy in 3-6 months.     A. If advanced adenoma, repeat in 1 year    B. If no advanced adenoma, repeat in 3 years, then every 5 years   5. PET/CT scan is not recommended.  C. Stage IV   1. H+P every 3-6 months x 2 years and then every 6 months for a total of 5 years    2. CEA every 3 months x 2 years and then every 6 months for a total of 3- 5 years    3. CT CAP every 3-6 months (category 2B for frequency < 6 months) x 2 years, then every 6-12 months for a total of 5 years .   4. Colonoscopy in 1 year except if no preoperative colonoscopy due to obstructing lesion, colonoscopy in 3-6 months.     A. If advanced adenoma, repeat in 1 year    B. If no advanced adenoma, repeat in 3 years, then every 5 years   All questions were answered. The patient knows to call the clinic with any problems, questions or concerns. We can  certainly see the patient much sooner if necessary.  Patient and plan discussed with Dr. Twana First and she is in agreement with the aforementioned.   This note is electronically signed by: Doy Mince 03/05/2017 12:21 PM

## 2017-03-05 NOTE — Patient Instructions (Addendum)
[place] at [facility] Discharge Instructions  RECOMMENDATIONS MADE BY THE CONSULTANT AND ANY TEST RESULTS WILL BE SENT TO YOUR REFERRING PHYSICIAN.    Labs in 12 months and return for follow up in 12 months   Thank you for choosing [place] at [facility] to provide your oncology and hematology care.  To afford each patient quality time with our provider, please arrive at least 15 minutes before your scheduled appointment time.    If you have a lab appointment with the [facility] please come in thru the  Main Entrance and check in at the main information desk  You need to re-schedule your appointment should you arrive 10 or more minutes late.  We strive to give you quality time with our providers, and arriving late affects you and other patients whose appointments are after yours.  Also, if you no show three or more times for appointments you may be dismissed from the clinic at the providers discretion.     Again, thank you for choosing [facility].  Our hope is that these requests will decrease the amount of time that you wait before being seen by our physicians.       _____________________________________________________________  Should you have questions after your visit to [facility], please contact our office at [phone] between the hours of 8:30 a.m. and 4:30 p.m.  Voicemails left after 4:30 p.m. will not be returned until the following business day.  For prescription refill requests, have your pharmacy contact our office.       Resources For Cancer Patients and their Caregivers ? American Cancer Society: Can assist with transportation, wigs, general needs, runs Look Good Feel Better.        2508120181 ? Cancer Care: Provides financial assistance, online support groups, medication/co-pay assistance.  1-800-813-HOPE 831-886-0276) ? [place] Assists [place] cancer  patients and their families through emotional , educational and financial support.  (470)537-3021 ? [place] DSS Where to apply for food stamps, Medicaid and utility assistance. (418)849-0007 ? [facility]: Transportation to medical appointments. 854 478 0474 ? Social Security Administration: May apply for disability if have a Stage IV cancer. (984) 516-1702 716-720-4221 ? [employer], Disability and Transit Services: Assists with nutrition, care and transit needs. [place] Support Programs: @10RELATIVEDAYS @ > Cancer Support Group  2nd Tuesday of the month 1pm-2pm, Journey Room  > Creative Journey  3rd Tuesday of the month 1130am-1pm, Journey Room  > Look Good Feel Better  1st Wednesday of the month 10am-12 noon, Journey Room (Call [place] to register [phone])

## 2017-11-14 ENCOUNTER — Encounter (INDEPENDENT_AMBULATORY_CARE_PROVIDER_SITE_OTHER): Payer: Self-pay | Admitting: *Deleted

## 2017-11-30 ENCOUNTER — Other Ambulatory Visit (INDEPENDENT_AMBULATORY_CARE_PROVIDER_SITE_OTHER): Payer: Self-pay | Admitting: *Deleted

## 2017-11-30 DIAGNOSIS — Z85038 Personal history of other malignant neoplasm of large intestine: Secondary | ICD-10-CM | POA: Insufficient documentation

## 2018-01-07 ENCOUNTER — Telehealth (INDEPENDENT_AMBULATORY_CARE_PROVIDER_SITE_OTHER): Payer: Self-pay | Admitting: *Deleted

## 2018-01-07 ENCOUNTER — Encounter (INDEPENDENT_AMBULATORY_CARE_PROVIDER_SITE_OTHER): Payer: Self-pay | Admitting: *Deleted

## 2018-01-07 MED ORDER — PEG 3350-KCL-NA BICARB-NACL 420 G PO SOLR: 4000.0000 mL | Freq: Once | ORAL | 0 refills | Status: AC

## 2018-01-07 NOTE — Telephone Encounter (Signed)
Patient needs trilyte 

## 2018-01-23 ENCOUNTER — Telehealth (INDEPENDENT_AMBULATORY_CARE_PROVIDER_SITE_OTHER): Payer: Self-pay | Admitting: *Deleted

## 2018-01-23 NOTE — Telephone Encounter (Signed)
Referring MD/PCP: Macarthur Critchley   Procedure: tcs  Reason/Indication:  Hx colon ca  Has patient had this procedure before?  Yes, 2016  If so, when, by whom and where?    Is there a family history of colon cancer?  no  Who?  What age when diagnosed?    Is patient diabetic?   no      Does patient have prosthetic heart valve or mechanical valve?  no  Do you have a pacemaker?  no  Has patient ever had endocarditis? no  Has patient had joint replacement within last 12 months?  no  Is patient constipated or do they take laxatives? no  Does patient have a history of alcohol/drug use?  no  Is patient on blood thinner such as Coumadin, Plavix and/or Aspirin? yes  Medications: losartan 100 mg daily, asa 81 mg daily, diclofenac 50 mg daily, lovastatin 20 mg daily, amlodipine 5 mg daily  Allergies: tetanus  Medication Adjustment per Dr Lindi Adie, NP: asa 2 days  Procedure date & time: 02/14/18 at 1030

## 2018-01-23 NOTE — Telephone Encounter (Signed)
agree

## 2018-02-14 ENCOUNTER — Ambulatory Visit (HOSPITAL_COMMUNITY)
Admission: RE | Admit: 2018-02-14 | Discharge: 2018-02-14 | Disposition: A | Discharge status: Home / Self Care | Payer: Medicare Other | Source: Ambulatory Visit | Attending: Internal Medicine | Admitting: Internal Medicine

## 2018-02-14 ENCOUNTER — Other Ambulatory Visit: Payer: Self-pay

## 2018-02-14 ENCOUNTER — Encounter (HOSPITAL_COMMUNITY)
Admission: RE | Disposition: A | Discharge status: Home / Self Care | Payer: Self-pay | Source: Ambulatory Visit | Attending: Internal Medicine

## 2018-02-14 ENCOUNTER — Encounter (HOSPITAL_COMMUNITY): Payer: Self-pay

## 2018-02-14 DIAGNOSIS — Z8719 Personal history of other diseases of the digestive system: Secondary | ICD-10-CM | POA: Diagnosis not present

## 2018-02-14 DIAGNOSIS — Z7982 Long term (current) use of aspirin: Secondary | ICD-10-CM | POA: Diagnosis not present

## 2018-02-14 DIAGNOSIS — Z980 Intestinal bypass and anastomosis status: Secondary | ICD-10-CM | POA: Diagnosis not present

## 2018-02-14 DIAGNOSIS — Z1211 Encounter for screening for malignant neoplasm of colon: Secondary | ICD-10-CM | POA: Insufficient documentation

## 2018-02-14 DIAGNOSIS — F17210 Nicotine dependence, cigarettes, uncomplicated: Secondary | ICD-10-CM | POA: Diagnosis not present

## 2018-02-14 DIAGNOSIS — K5730 Diverticulosis of large intestine without perforation or abscess without bleeding: Secondary | ICD-10-CM

## 2018-02-14 DIAGNOSIS — I10 Essential (primary) hypertension: Secondary | ICD-10-CM | POA: Insufficient documentation

## 2018-02-14 DIAGNOSIS — D125 Benign neoplasm of sigmoid colon: Secondary | ICD-10-CM | POA: Diagnosis not present

## 2018-02-14 DIAGNOSIS — Z08 Encounter for follow-up examination after completed treatment for malignant neoplasm: Secondary | ICD-10-CM | POA: Diagnosis not present

## 2018-02-14 DIAGNOSIS — E7800 Pure hypercholesterolemia, unspecified: Secondary | ICD-10-CM | POA: Diagnosis not present

## 2018-02-14 DIAGNOSIS — Z9221 Personal history of antineoplastic chemotherapy: Secondary | ICD-10-CM | POA: Diagnosis not present

## 2018-02-14 DIAGNOSIS — Z8546 Personal history of malignant neoplasm of prostate: Secondary | ICD-10-CM | POA: Insufficient documentation

## 2018-02-14 DIAGNOSIS — K635 Polyp of colon: Secondary | ICD-10-CM | POA: Diagnosis not present

## 2018-02-14 DIAGNOSIS — K5520 Angiodysplasia of colon without hemorrhage: Secondary | ICD-10-CM | POA: Diagnosis not present

## 2018-02-14 DIAGNOSIS — Z79899 Other long term (current) drug therapy: Secondary | ICD-10-CM | POA: Diagnosis not present

## 2018-02-14 DIAGNOSIS — Z85038 Personal history of other malignant neoplasm of large intestine: Secondary | ICD-10-CM | POA: Insufficient documentation

## 2018-02-14 HISTORY — DX: Headache, unspecified: R51.9

## 2018-02-14 HISTORY — DX: Essential (primary) hypertension: I10

## 2018-02-14 HISTORY — PX: POLYPECTOMY: SHX5525

## 2018-02-14 HISTORY — DX: Headache: R51

## 2018-02-14 HISTORY — PX: BIOPSY: SHX5522

## 2018-02-14 HISTORY — PX: COLONOSCOPY: SHX5424

## 2018-02-14 SURGERY — COLONOSCOPY
Anesthesia: Moderate Sedation

## 2018-02-14 MED ORDER — MEPERIDINE HCL 50 MG/ML IJ SOLN
INTRAMUSCULAR | Status: AC
  Filled 2018-02-14: qty 1

## 2018-02-14 MED ORDER — STERILE WATER FOR IRRIGATION IR SOLN
Status: DC | PRN
  Administered 2018-02-14: 11:00:00

## 2018-02-14 MED ORDER — MIDAZOLAM HCL 5 MG/5ML IJ SOLN
INTRAMUSCULAR | Status: DC | PRN
  Administered 2018-02-14: 2 mg via INTRAVENOUS
  Administered 2018-02-14: 1 mg via INTRAVENOUS
  Administered 2018-02-14: 2 mg via INTRAVENOUS

## 2018-02-14 MED ORDER — SODIUM CHLORIDE 0.9 % IV SOLN
INTRAVENOUS | Status: DC
  Administered 2018-02-14: 09:00:00 via INTRAVENOUS

## 2018-02-14 MED ORDER — MIDAZOLAM HCL 5 MG/5ML IJ SOLN
INTRAMUSCULAR | Status: AC
  Filled 2018-02-14: qty 10

## 2018-02-14 MED ORDER — MEPERIDINE HCL 50 MG/ML IJ SOLN
INTRAMUSCULAR | Status: DC | PRN
  Administered 2018-02-14 (×2): 25 mg via INTRAVENOUS

## 2018-02-14 NOTE — Op Note (Signed)
[facility] Patient Name: Roberto [name] Procedure Date: 02/14/2018 10:12 AM MRN: [id number] Date of Birth: 06/20/1941 Attending MD: Hildred Laser , MD CSN: [id number] Age: 77 Admit Type: Outpatient Procedure:                Colonoscopy Indications:              High risk colon cancer surveillance: Personal                            history of colon cancer Providers:                Hildred Laser, MD, [place]is Peak B. Sharon Seller, RN, Aram Candela Referring MD:             [name], MD Medicines:                Meperidine 50 mg IV, Midazolam 5 mg IV Complications:            No immediate complications. Estimated Blood Loss:     Estimated blood loss was minimal. Procedure:                Pre-Anesthesia Assessment:                           - Prior to the procedure, a History and Physical                            was performed, and patient medications and                            allergies were reviewed. The patient's tolerance of                            previous anesthesia was also reviewed. The risks                            and benefits of the procedure and the sedation                            options and risks were discussed with the patient.                            All questions were answered, and informed consent                            was obtained. Prior Anticoagulants: The patient                            last took aspirin 3 days and previous NSAID                            medication 4 days prior to the procedure. ASA Grade  Assessment: II - A patient with mild systemic                            disease. After reviewing the risks and benefits,                            the patient was deemed in satisfactory condition to                            undergo the procedure.                           After obtaining informed consent, the colonoscope                            was passed under direct  vision. Throughout the                            procedure, the patient's blood pressure, pulse, and                            oxygen saturations were monitored continuously. The                            PCF-H190DL ([id number]) scope was introduced through                            the anus and advanced to the the ileocolonic                            anastomosis. The colonoscopy was performed without                            difficulty. The patient tolerated the procedure                            well. The quality of the bowel preparation was                            good. The terminal ileum and the rectum were                            photographed. Scope In: 10:37:30 AM Scope Out: 11:16:30 AM Scope Withdrawal Time: 0 hours 33 minutes 37 seconds  Total Procedure Duration: 0 hours 39 minutes 0 seconds  Findings:      The perianal and digital rectal examinations were normal.      There was evidence of a prior end-to-side colo-colonic anastomosis at       the hepatic flexure. This was patent and was characterized by healthy       appearing mucosa. The anastomosis was traversed.      A small polyp was found in the proximal sigmoid colon. The polyp was       sessile. Biopsies were taken with a cold forceps for histology. The  pathology specimen was placed into Bottle Number 1.      A small polyp was found in the mid sigmoid colon. The polyp was sessile.       The polyp was removed with a cold snare. Resection and retrieval were       complete. The pathology specimen was placed into Bottle Number 1.      A 15 to 20 mm polyp was found in the mid sigmoid colon. The polyp was       flat. Area was successfully injected with [place] for a lift       polypectomy. The polyp was removed with a piecemeal technique using a       hot snare. Polyp resection was incomplete. The resected tissue was       retrieved. Coagulation for destruction of remaining portion of lesion       using  argon plasma was successful. The pathology specimen was placed       into Bottle Number 2.      Scattered medium-mouthed diverticula were found in the sigmoid colon.      A few small angiodysplastic lesions without bleeding were found in the       distal rectum. Impression:               - Patent end-to-side colo-colonic anastomosis,                            characterized by healthy appearing mucosa.                           - One small polyp in the proximal sigmoid colon.                            Biopsied.                           - One small polyp in the mid sigmoid colon, removed                            with a cold snare. Resected and retrieved.                           - One 15 to 20 mm polyp in the mid sigmoid colon,                            removed piecemeal using a hot snare. Incomplete                            resection. Resected tissue retrieved. Injected.                            Residual polyp coagulated with argon plasma                            coagulation (APC).                           - Diverticulosis in the sigmoid colon.                           -  A few non-bleeding colonic angiodysplastic                            lesions. Moderate Sedation:      Moderate (conscious) sedation was administered by the endoscopy nurse       and supervised by the endoscopist. The following parameters were       monitored: oxygen saturation, heart rate, blood pressure, CO2       capnography and response to care. Total physician intraservice time was       44 minutes. Recommendation:           - Patient has a contact number available for                            emergencies. The signs and symptoms of potential                            delayed complications were discussed with the                            patient. Return to normal activities tomorrow.                            Written discharge instructions were provided to the                             patient.                           - High fiber diet today.                           - Continue present medications.                           - No aspirin, ibuprofen, naproxen, or other                            non-steroidal anti-inflammatory drugs for 7 days                            after polyp removal.                           - Await pathology results.                           - Repeat colonoscopy is recommended. The                            colonoscopy date will be determined after pathology                            results from today's exam become available for                            review. Procedure  Code(s):        --- Professional ---                           [phone], Colonoscopy, flexible; with removal of                            tumor(s), polyp(s), or other lesion(s) by snare                            technique                           45380, 59, Colonoscopy, flexible; with biopsy,                            single or multiple                           45381, Colonoscopy, flexible; with directed                            submucosal injection(s), any substance                           G0500, Moderate sedation services provided by the                            same physician or other qualified health care                            professional performing a gastrointestinal                            endoscopic service that sedation supports,                            requiring the presence of an independent trained                            observer to assist in the monitoring of the                            patient's level of consciousness and physiological                            status; initial 15 minutes of intra-service time;                            patient age 10 years or older (additional time may                            be reported with 99153, as appropriate)                           [phone], Moderate sedation services provided by the  same physician or other qualified health care                            professional performing the diagnostic or                            therapeutic service that the sedation supports,                            requiring the presence of an independent trained                            observer to assist in the monitoring of the                            patient's level of consciousness and physiological                            status; each additional 15 minutes intraservice                            time (List separately in addition to code for                            primary service)                           [phone], Moderate sedation services provided by the                            same physician or other qualified health care                            professional performing the diagnostic or                            therapeutic service that the sedation supports,                            requiring the presence of an independent trained                            observer to assist in the monitoring of the                            patient's level of consciousness and physiological                            status; each additional 15 minutes intraservice                            time (List separately in addition to code for  primary service) Diagnosis Code(s):        --- Professional ---                           [phone], Personal history of other malignant                            neoplasm of large intestine                           Z98.0, Intestinal bypass and anastomosis status                           D12.5, Benign neoplasm of sigmoid colon                           K55.20, Angiodysplasia of colon without hemorrhage                           K57.30, Diverticulosis of large intestine without                            perforation or abscess without bleeding CPT copyright 2017 American Medical Association.  All rights reserved. The codes documented in this report are preliminary and upon coder review may  be revised to meet current compliance requirements. Hildred Laser, MD Hildred Laser, MD 02/14/2018 11:33:30 AM This report has been signed electronically. Number of Addenda: 0

## 2018-02-14 NOTE — Discharge Instructions (Signed)
No aspirin or NSAIDs for 1 week. Resume other medications as before. High-fiber diet. No driving for 24 hours. Physician will call with biopsy results.     Colon Polyps Polyps are tissue growths inside the body. Polyps can grow in many places, including the large intestine (colon). A polyp may be a round bump or a mushroom-shaped growth. You could have one polyp or several. Most colon polyps are noncancerous (benign). However, some colon polyps can become cancerous over time. What are the causes? The exact cause of colon polyps is not known. What increases the risk? This condition is more likely to develop in people who:  Have a family history of colon cancer or colon polyps.  Are older than 58 or older than 45 if they are African American.  Have inflammatory bowel disease, such as ulcerative colitis or Crohn disease.  Are overweight.  Smoke cigarettes.  Do not get enough exercise.  Drink too much alcohol.  Eat a diet that is: ? High in fat and red meat. ? Low in fiber.  Had childhood cancer that was treated with abdominal radiation.  What are the signs or symptoms? Most polyps do not cause symptoms. If you have symptoms, they may include:  Blood coming from your rectum when having a bowel movement.  Blood in your stool.The stool may look dark red or black.  A change in bowel habits, such as constipation or diarrhea.  How is this diagnosed? This condition is diagnosed with a colonoscopy. This is a procedure that uses a lighted, flexible scope to look at the inside of your colon. How is this treated? Treatment for this condition involves removing any polyps that are found. Those polyps will then be tested for cancer. If cancer is found, your health care provider will talk to you about options for colon cancer treatment. Follow these instructions at home: Diet  Eat plenty of fiber, such as fruits, vegetables, and whole grains.  Eat foods that are high in calcium  and vitamin D, such as milk, cheese, yogurt, eggs, liver, fish, and broccoli.  Limit foods high in fat, red meats, and processed meats, such as hot dogs, sausage, bacon, and lunch meats.  Maintain a healthy weight, or lose weight if recommended by your health care provider. General instructions  Do not smoke cigarettes.  Do not drink alcohol excessively.  Keep all follow-up visits as told by your health care provider. This is important. This includes keeping regularly scheduled colonoscopies. Talk to your health care provider about when you need a colonoscopy.  Exercise every day or as told by your health care provider. Contact a health care provider if:  You have new or worsening bleeding during a bowel movement.  You have new or increased blood in your stool.  You have a change in bowel habits.  You unexpectedly lose weight. This information is not intended to replace advice given to you by your health care provider. Make sure you discuss any questions you have with your health care provider. Document Released: 04/19/2004 Document Revised: 12/30/2015 Document Reviewed: 06/14/2015 Elsevier Interactive Patient Education  2018 [place].  High-Fiber Diet Fiber, also called dietary fiber, is a type of carbohydrate found in fruits, vegetables, whole grains, and beans. A high-fiber diet can have many health benefits. Your health care provider may recommend a high-fiber diet to help:  Prevent constipation. Fiber can make your bowel movements more regular.  Lower your cholesterol.  Relieve hemorrhoids, uncomplicated diverticulosis, or irritable bowel syndrome.  Prevent  overeating as part of a weight-loss plan.  Prevent heart disease, type 2 diabetes, and certain cancers.  What is my plan? The recommended daily intake of fiber includes:  38 grams for men under age 28.  24 grams for men over age 10.  63 grams for women under age 62.  81 grams for women over age 82.  You  can get the recommended daily intake of dietary fiber by eating a variety of fruits, vegetables, grains, and beans. Your health care provider may also recommend a fiber supplement if it is not possible to get enough fiber through your diet. What do I need to know about a high-fiber diet?  Fiber supplements have not been widely studied for their effectiveness, so it is better to get fiber through food sources.  Always check the fiber content on thenutrition facts label of any prepackaged food. Look for foods that contain at least 5 grams of fiber per serving.  Ask your dietitian if you have questions about specific foods that are related to your condition, especially if those foods are not listed in the following section.  Increase your daily fiber consumption gradually. Increasing your intake of dietary fiber too quickly may cause bloating, cramping, or gas.  Drink plenty of water. Water helps you to digest fiber. What foods can I eat? Grains Whole-grain breads. Multigrain cereal. Oats and oatmeal. Brown rice. Barley. Bulgur wheat. [place]. Bran muffins. Popcorn. Rye wafer crackers. Vegetables Sweet potatoes. Spinach. Kale. Artichokes. Cabbage. Broccoli. Green peas. Carrots. Squash. Fruits Berries. Pears. Apples. Oranges. Avocados. Prunes and raisins. Dried figs. Meats and Other Protein Sources [place], kidney, pinto, and soy beans. Split peas. Lentils. Nuts and seeds. Dairy Fiber-fortified yogurt. Beverages Fiber-fortified soy milk. Fiber-fortified orange juice. Other Fiber bars. The items listed above may not be a complete list of recommended foods or beverages. Contact your dietitian for more options. What foods are not recommended? Grains White bread. Pasta made with refined flour. White rice. Vegetables Fried potatoes. Canned vegetables. Well-cooked vegetables. Fruits Fruit juice. Cooked, strained fruit. Meats and Other Protein Sources Fatty cuts of meat. Fried Sales executive or fried  fish. Dairy Milk. Yogurt. Cream cheese. Sour cream. Beverages Soft drinks. Other Cakes and pastries. Butter and oils. The items listed above may not be a complete list of foods and beverages to avoid. Contact your dietitian for more information. What are some tips for including high-fiber foods in my diet?  Eat a wide variety of high-fiber foods.  Make sure that half of all grains consumed each day are whole grains.  Replace breads and cereals made from refined flour or white flour with whole-grain breads and cereals.  Replace white rice with brown rice, bulgur wheat, or millet.  Start the day with a breakfast that is high in fiber, such as a cereal that contains at least 5 grams of fiber per serving.  Use beans in place of meat in soups, salads, or pasta.  Eat high-fiber snacks, such as berries, raw vegetables, nuts, or popcorn. This information is not intended to replace advice given to you by your health care provider. Make sure you discuss any questions you have with your health care provider. Document Released: 07/24/2005 Document Revised: 12/30/2015 Document Reviewed: 01/06/2014 Elsevier Interactive Patient Education  [employer].

## 2018-02-14 NOTE — H&P (Signed)
Roberto [name] is an 77 y.o. male.   Chief Complaint: Patient is here for colonoscopy. HPI: Patient is 77 year old Caucasian male who has history of colon carcinoma and is here for surveillance colonoscopy.  Last exam was 3 years ago with removal of 1 polyp was not an adenoma.  He denies abdominal pain rectal bleeding.  He has history of prostate carcinoma.  He had surgery in 2004.  He had recurrence last year any received radiation therapy from December 2018 through February 2019.  He developed diarrhea but it has gotten better. Family history is negative for CRC.  Past Medical History:  Diagnosis Date  . 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 Colon cancer ([place])    colon 2010/surg and chemo  . Headache   . Hypercholesteremia   . Hypertension   . Port-A-Cath in place 04/02/2013  . Prostate cancer ([place])    prostate 2004/surg  . Varicose veins of lower limb 1985   rt. leg    Past Surgical History:  Procedure Laterality Date  . COLON SURGERY    . COLONOSCOPY N/A 11/11/2014   Procedure: COLONOSCOPY;  Surgeon: Rogene Houston, MD;  [facility];  Service: Endoscopy;  Laterality: N/A;  830  . HAND TENDON SURGERY     lt. hand repair  . PORT-A-CATH REMOVAL Left 10/11/2015   Procedure: REMOVAL PORT-A-CATH LEFT CHEST;  Surgeon: Aviva Signs, MD;  Location: [facility];  Service: General;  Laterality: Left;  . PROSTATE SURGERY    . STYLOID PROCESS EXCISION    . VARICOSE VEIN SURGERY  1980's    Family History  Problem Relation Age of Onset  . Cancer Father    Social History:  reports that he has been smoking cigarettes.  He has a 51.00 pack-year smoking history. He has never used smokeless tobacco. He reports that he does not drink alcohol or use drugs.  Allergies:  Allergies  Allergen Reactions  . Aspirin Other (See Comments)    No high dosages - tightness in the chest  . Tetanus Toxoids Swelling    Arm became very swollen after injection (occurred approx. 40 years ago.)     Medications Prior to Admission  Medication Sig Dispense Refill  . acetaminophen (TYLENOL) 325 MG tablet Take 650 mg by mouth every 6 (six) hours as needed for mild pain.     [name] amLODipine (NORVASC) 5 MG tablet Take 5 mg by mouth daily.  3  . aspirin 81 MG tablet Take 81 mg by mouth daily.      . diclofenac (CATAFLAM) 50 MG tablet Take 50 mg by mouth daily.     [name] losartan (COZAAR) 100 MG tablet Take 100 mg by mouth daily.  1  . lovastatin (MEVACOR) 20 MG tablet Take 20 mg by mouth daily.      No results found for this or any previous visit (from the past 48 hour(s)). No results found.  ROS  Blood pressure (!) 148/71, pulse 93, temperature 98 F (36.7 C), temperature source Oral, resp. rate 18, height 5\' 10"  (1.778 m), weight 193 lb (87.5 kg), SpO2 95 %. Physical Exam  Constitutional: He appears well-developed and well-nourished.  HENT:  Mouth/Throat: Oropharynx is clear and moist.  Eyes: Conjunctivae are normal. No scleral icterus.  Neck: No thyromegaly present.  Cardiovascular: Normal rate, regular rhythm and normal heart sounds.  No murmur heard. Respiratory: Effort normal and breath sounds normal.  GI:  Diminished symmetrical with lower midline and right  paramedian scars.  Abdomen is soft and nontender with organomegaly or masses.  Musculoskeletal: He exhibits no edema.  Lymphadenopathy:    He has no cervical adenopathy.  Neurological: He is alert.  Skin: Skin is warm and dry.     Assessment/Plan History of colon carcinoma. Surveillance colonoscopy.  Hildred Laser, MD 02/14/2018, 10:25 AM

## 2018-02-19 ENCOUNTER — Encounter (HOSPITAL_COMMUNITY): Payer: Self-pay | Admitting: Internal Medicine

## 2018-02-27 ENCOUNTER — Inpatient Hospital Stay (HOSPITAL_COMMUNITY): Payer: Medicare Other | Attending: Hematology

## 2018-02-27 DIAGNOSIS — F17210 Nicotine dependence, cigarettes, uncomplicated: Secondary | ICD-10-CM | POA: Diagnosis not present

## 2018-02-27 DIAGNOSIS — Z923 Personal history of irradiation: Secondary | ICD-10-CM | POA: Diagnosis not present

## 2018-02-27 DIAGNOSIS — Z8546 Personal history of malignant neoplasm of prostate: Secondary | ICD-10-CM | POA: Insufficient documentation

## 2018-02-27 DIAGNOSIS — Z85038 Personal history of other malignant neoplasm of large intestine: Secondary | ICD-10-CM | POA: Insufficient documentation

## 2018-02-27 DIAGNOSIS — Z79899 Other long term (current) drug therapy: Secondary | ICD-10-CM | POA: Insufficient documentation

## 2018-02-27 DIAGNOSIS — Z7982 Long term (current) use of aspirin: Secondary | ICD-10-CM | POA: Insufficient documentation

## 2018-02-27 DIAGNOSIS — C182 Malignant neoplasm of ascending colon: Secondary | ICD-10-CM

## 2018-02-27 DIAGNOSIS — Z9221 Personal history of antineoplastic chemotherapy: Secondary | ICD-10-CM | POA: Diagnosis not present

## 2018-02-27 DIAGNOSIS — I10 Essential (primary) hypertension: Secondary | ICD-10-CM | POA: Diagnosis not present

## 2018-02-27 DIAGNOSIS — C61 Malignant neoplasm of prostate: Secondary | ICD-10-CM

## 2018-02-27 LAB — CBC WITH DIFFERENTIAL/PLATELET
BASOS ABS: 0 10*3/uL (ref 0.0–0.1)
Basophils Relative: 0 %
Eosinophils Absolute: 0.4 10*3/uL (ref 0.0–0.7)
Eosinophils Relative: 5 %
HEMATOCRIT: 44 % (ref 39.0–52.0)
Hemoglobin: 14.3 g/dL (ref 13.0–17.0)
LYMPHS PCT: 26 %
Lymphs Abs: 2.5 10*3/uL (ref 0.7–4.0)
MCH: 30.4 pg (ref 26.0–34.0)
MCHC: 32.5 g/dL (ref 30.0–36.0)
MCV: 93.4 fL (ref 78.0–100.0)
Monocytes Absolute: 0.5 10*3/uL (ref 0.1–1.0)
Monocytes Relative: 5 %
NEUTROS ABS: 6 10*3/uL (ref 1.7–7.7)
Neutrophils Relative %: 64 %
Platelets: 290 10*3/uL (ref 150–400)
RBC: 4.71 MIL/uL (ref 4.22–5.81)
RDW: 13.9 % (ref 11.5–15.5)
WBC: 9.4 10*3/uL (ref 4.0–10.5)

## 2018-02-27 LAB — COMPREHENSIVE METABOLIC PANEL
ALT: 13 U/L (ref 0–44)
AST: 17 U/L (ref 15–41)
Albumin: 3.7 g/dL (ref 3.5–5.0)
Alkaline Phosphatase: 98 U/L (ref 38–126)
Anion gap: 8 (ref 5–15)
BILIRUBIN TOTAL: 0.4 mg/dL (ref 0.3–1.2)
BUN: 25 mg/dL — AB (ref 8–23)
CHLORIDE: 106 mmol/L (ref 98–111)
CO2: 26 mmol/L (ref 22–32)
CREATININE: 1.27 mg/dL — AB (ref 0.61–1.24)
Calcium: 9.1 mg/dL (ref 8.9–10.3)
GFR calc Af Amer: >60 mL/min (ref >60)
GFR, EST NON AFRICAN AMERICAN: 54 mL/min — AB (ref >60)
Glucose, Bld: 208 mg/dL — ABNORMAL HIGH (ref 70–99)
Potassium: 4.5 mmol/L (ref 3.5–5.1)
Sodium: 140 mmol/L (ref 135–145)
Total Protein: 7.1 g/dL (ref 6.5–8.1)

## 2018-02-27 LAB — PSA: PROSTATIC SPECIFIC ANTIGEN: 0.55 ng/mL (ref 0.00–4.00)

## 2018-02-28 ENCOUNTER — Other Ambulatory Visit (HOSPITAL_COMMUNITY): Payer: Medicare Other

## 2018-02-28 LAB — CEA: CEA1: 5.6 ng/mL — AB (ref 0.0–4.7)

## 2018-03-05 ENCOUNTER — Encounter (HOSPITAL_COMMUNITY): Payer: Self-pay | Admitting: Internal Medicine

## 2018-03-05 ENCOUNTER — Inpatient Hospital Stay (HOSPITAL_BASED_OUTPATIENT_CLINIC_OR_DEPARTMENT_OTHER): Payer: Medicare Other | Admitting: Internal Medicine

## 2018-03-05 ENCOUNTER — Other Ambulatory Visit: Payer: Self-pay

## 2018-03-05 ENCOUNTER — Other Ambulatory Visit (HOSPITAL_COMMUNITY): Payer: Medicare Other

## 2018-03-05 VITALS — BP 138/60 | HR 79 | Temp 98.1°F | Resp 18 | Wt 192.1 lb

## 2018-03-05 DIAGNOSIS — Z85038 Personal history of other malignant neoplasm of large intestine: Secondary | ICD-10-CM

## 2018-03-05 DIAGNOSIS — C182 Malignant neoplasm of ascending colon: Secondary | ICD-10-CM

## 2018-03-05 DIAGNOSIS — F17210 Nicotine dependence, cigarettes, uncomplicated: Secondary | ICD-10-CM | POA: Diagnosis not present

## 2018-03-05 DIAGNOSIS — Z8546 Personal history of malignant neoplasm of prostate: Secondary | ICD-10-CM

## 2018-03-05 DIAGNOSIS — Z923 Personal history of irradiation: Secondary | ICD-10-CM

## 2018-03-05 DIAGNOSIS — C61 Malignant neoplasm of prostate: Secondary | ICD-10-CM

## 2018-03-05 DIAGNOSIS — I10 Essential (primary) hypertension: Secondary | ICD-10-CM

## 2018-03-05 DIAGNOSIS — Z9221 Personal history of antineoplastic chemotherapy: Secondary | ICD-10-CM

## 2018-03-05 DIAGNOSIS — Z79899 Other long term (current) drug therapy: Secondary | ICD-10-CM

## 2018-03-05 DIAGNOSIS — Z7982 Long term (current) use of aspirin: Secondary | ICD-10-CM

## 2018-03-05 NOTE — Progress Notes (Signed)
Diagnosis Malignant neoplasm of ascending colon (Roberto [name]) - Plan: CBC with Differential/Platelet, Comprehensive metabolic panel, Lactate dehydrogenase, CEA, PSA  Prostate cancer (Roberto [name]) - Plan: CBC with Differential/Platelet, Comprehensive metabolic panel, Lactate dehydrogenase, CEA, PSA  Staging Cancer Staging No matching staging information was found for the patient.  Assessment and Plan: 1.  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    Pt is seen today for follow-up.  He is here to go over labs. Labs done 02/27/2018 showed WBC 9.4 HB 14.3 plts 290,000.  Cr 1.27.  CEA  5.6 which is mildly increased, but pt is a smoker.    He has undergone colonoscopy on 02/14/2018 with hyperplastic polyps removed with no malignancy seen.    He should continue to follow-up with GI as directed.  Pt will be seen for follow-up in 1 year with labs.    2.  Prostate cancer (05/2003), S/P radical prostatectomy in October 2014.  Pt had a slowly rising PSA concerning for biochemical recurrence.  Imaging in 2016 was negative for metastatic disease.  His urologist is in [place], [place].  He was seen by Dr. Meda [name], Roberto [name], in [place], [place]  For XRT.  He has completed RT.  Pt should continue to follow-up with urology as directed.  PSA is 0.55 on labs done 02/27/2018.    3.  HTN.  BP is 138/60.  Follow-up with PCP.     4.  Health maintenance.  Follow-up with GI as directed.    5.  Smoking.  Cessation is recommended.    Interval History:  Historical data obtained from the note dated 03/05/2017.  77 y.o. male returns for followup of H/O Stage IIIB cancer of ascending colon ([id number]), grade 2, with definitive surgery on 02/22/2009 followed by XELOX x 6 cycles finishing all therapy on 07/21/2009. AND Prostate cancer (05/2003), S/P radical prostatectomy in October 2014 AND Pulmonary asbestosis  Current Status:  Pt  is seen today for follow-up. He is here to go over labs.  He reports recent colonoscopy.      Colon cancer ([place])   05/30/2011 Initial Diagnosis    Colon cancer ([place])      09/16/2014 Imaging    CT CAP- 1. No evidence of metastatic disease from the patient's colon or prostate cancer. 2. Stable atherosclerosis. 3. Stable bilateral adrenal hyperplasia.         Problem List Patient Active Problem List   Diagnosis Date Noted  . History of colon cancer [Z85.038] 11/30/2017  . Prostate cancer ([place]) [C61] 09/03/2014  . Leukocytosis [D72.829] 09/03/2014  . Port-A-Cath in place [facility] 04/02/2013  . Colon cancer ([place]) [C18.9] 05/30/2011    Past Medical History Past Medical History:  Diagnosis Date  . 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 Colon cancer ([place])    colon 2010/surg and chemo  . Headache   . Hypercholesteremia   . Hypertension   . Port-A-Cath in place 04/02/2013  . Prostate cancer ([place])    prostate 2004/surg  . Varicose veins of lower limb 1985   rt. leg    Past Surgical History Past Surgical History:  Procedure Laterality Date  . BIOPSY  02/14/2018   Procedure: BIOPSY;  Surgeon: Roberto Houston, MD;  [facility];  Service: Endoscopy;;  colon  . COLON SURGERY    . COLONOSCOPY N/A 11/11/2014   Procedure: COLONOSCOPY;  Surgeon: Roberto Dawley  Laural Golden, MD;  [facility];  Service: Endoscopy;  Laterality: N/A;  830  . COLONOSCOPY N/A 02/14/2018   Procedure: COLONOSCOPY;  Surgeon: Roberto Houston, MD;  [facility];  Service: Endoscopy;  Laterality: N/A;  1030  . HAND TENDON SURGERY     lt. hand repair  . POLYPECTOMY  02/14/2018   Procedure: POLYPECTOMY;  Surgeon: Roberto Houston, MD;  [facility];  Service: Endoscopy;;  colon  . PORT-A-CATH REMOVAL Left 10/11/2015   Procedure: REMOVAL PORT-A-CATH LEFT CHEST;  Surgeon: Roberto Signs, MD;  Location: [facility];  Service: General;  Laterality: Left;  . PROSTATE SURGERY    .  STYLOID PROCESS EXCISION    . VARICOSE VEIN SURGERY  1980's    Family History Family History  Problem Relation Age of Onset  . Cancer Father      Social History  reports that he has been smoking cigarettes.  He has a 51.00 pack-year smoking history. He has never used smokeless tobacco. He reports that he does not drink alcohol or use drugs.  Medications  Current Outpatient Medications:  .  acetaminophen (TYLENOL) 325 MG tablet, Take 650 mg by mouth every 6 (six) hours as needed for mild pain. , Disp: , Rfl:  .  amLODipine (NORVASC) 5 MG tablet, Take 5 mg by mouth daily., Disp: , Rfl: 3 .  aspirin 81 MG tablet, Take 1 tablet (81 mg total) by mouth daily., Disp: 30 tablet, Rfl:  .  diclofenac (CATAFLAM) 50 MG tablet, Take 1 tablet (50 mg total) by mouth daily., Disp: , Rfl:  .  losartan (COZAAR) 100 MG tablet, Take 100 mg by mouth daily., Disp: , Rfl: 1 .  lovastatin (MEVACOR) 20 MG tablet, Take 20 mg by mouth daily., Disp: , Rfl:  .  meloxicam (MOBIC) 7.5 MG tablet, , Disp: , Rfl:   Allergies Aspirin and Tetanus toxoids  Review of Systems Review of Systems - Oncology ROS negative   Physical Exam  Vitals Wt Readings from Last 3 Encounters:  03/05/18 192 lb 1.6 oz (87.1 kg)  02/14/18 193 lb (87.5 kg)  03/05/17 184 lb (83.5 kg)   Temp Readings from Last 3 Encounters:  03/05/18 98.1 F (36.7 C) (Oral)  02/14/18 98 F (36.7 C) (Oral)  03/05/17 98.9 F (37.2 C) (Oral)   BP Readings from Last 3 Encounters:  03/05/18 138/60  02/14/18 (!) 106/50  03/05/17 132/64   Pulse Readings from Last 3 Encounters:  03/05/18 79  02/14/18 81  03/05/17 68   Constitutional: Well-developed, well-nourished, and in no distress.   HENT: Head: Normocephalic and atraumatic.  Mouth/Throat: No oropharyngeal exudate. Mucosa moist. Eyes: Pupils are equal, round, and reactive to light. Conjunctivae are normal. No scleral icterus.  Neck: Normal range of motion. Neck supple. No JVD present.   Cardiovascular: Normal rate, regular rhythm and normal heart sounds.  Exam reveals no gallop and no friction rub.   No murmur heard. Pulmonary/Chest: Effort normal and breath sounds normal. No respiratory distress. No wheezes.No rales.  Abdominal: Soft. Bowel sounds are normal. No distension. There is no tenderness. There is no guarding.  Musculoskeletal: No edema or tenderness.  Lymphadenopathy: No cervical, axillary or supraclavicular adenopathy.  Neurological: Alert and oriented to person, place, and time. No cranial nerve deficit.  Skin: Skin is warm and dry. No rash noted. No erythema. No pallor.  Psychiatric: Affect and judgment normal.   Labs No visits with results within 3 Day(s) from this visit.  Latest known visit with results is:  Appointment on 02/27/2018  Component Date Value Ref Range Status  . WBC 02/27/2018 9.4  4.0 - 10.5 K/uL Final  . RBC 02/27/2018 4.71  4.22 - 5.81 MIL/uL Final  . Hemoglobin 02/27/2018 14.3  13.0 - 17.0 g/dL Final  . HCT 02/27/2018 44.0  39.0 - 52.0 % Final  . MCV 02/27/2018 93.4  78.0 - 100.0 fL Final  . MCH 02/27/2018 30.4  26.0 - 34.0 pg Final  . MCHC 02/27/2018 32.5  30.0 - 36.0 g/dL Final  . RDW 02/27/2018 13.9  11.5 - 15.5 % Final  . Platelets 02/27/2018 290  150 - 400 K/uL Final  . Neutrophils Relative % 02/27/2018 64  % Final  . Neutro Abs 02/27/2018 6.0  1.7 - 7.7 K/uL Final  . Lymphocytes Relative 02/27/2018 26  % Final  . Lymphs Abs 02/27/2018 2.5  0.7 - 4.0 K/uL Final  . Monocytes Relative 02/27/2018 5  % Final  . Monocytes Absolute 02/27/2018 0.5  0.1 - 1.0 K/uL Final  . Eosinophils Relative 02/27/2018 5  % Final  . Eosinophils Absolute 02/27/2018 0.4  0.0 - 0.7 K/uL Final  . Basophils Relative 02/27/2018 0  % Final  . Basophils Absolute 02/27/2018 0.0  0.0 - 0.1 K/uL Final   Performed at [facility], [address], [place], [place] [address]  . Sodium 02/27/2018 140  135 - 145 mmol/L Final  . Potassium 02/27/2018 4.5  3.5 - 5.1  mmol/L Final  . Chloride 02/27/2018 106  98 - 111 mmol/L Final  . CO2 02/27/2018 26  22 - 32 mmol/L Final  . Glucose, Bld 02/27/2018 208* 70 - 99 mg/dL Final  . BUN 02/27/2018 25* 8 - 23 mg/dL Final  . Creatinine, Ser 02/27/2018 1.27* 0.61 - 1.24 mg/dL Final  . Calcium 02/27/2018 9.1  8.9 - 10.3 mg/dL Final  . Total Protein 02/27/2018 7.1  6.5 - 8.1 g/dL Final  . Albumin 02/27/2018 3.7  3.5 - 5.0 g/dL Final  . AST 02/27/2018 17  15 - 41 U/L Final  . ALT 02/27/2018 13  0 - 44 U/L Final  . Alkaline Phosphatase 02/27/2018 98  38 - 126 U/L Final  . Total Bilirubin 02/27/2018 0.4  0.3 - 1.2 mg/dL Final  . GFR calc non Af Amer 02/27/2018 54* >60 mL/min Final  . GFR calc Af Amer 02/27/2018 >60  >60 mL/min Final   Comment: (NOTE) The eGFR has been calculated using the CKD EPI equation. This calculation has not been validated in all clinical situations. eGFR's persistently <60 mL/min signify possible Chronic Kidney Disease.   Georgiann Hahn gap 02/27/2018 8  5 - 15 Final   Performed at [facility], [address] [place],  [address]  . CEA 02/27/2018 5.6* 0.0 - 4.7 ng/mL Final   Comment: (NOTE)                             Nonsmokers          <3.9                             Smokers             <5.6 [employer] Electrochemiluminescence Immunoassay (ECLIA) Values obtained with different assay methods or kits cannot be used interchangeably.  Results cannot be interpreted as absolute evidence of the presence or absence of  malignant disease. Performed At: [facility] [place], [place] [id number] Rush Farmer MD TK:[id number]   . Prostatic Specific Antigen 02/27/2018 0.55  0.00 - 4.00 ng/mL Final   Comment: (NOTE) While PSA levels of <=4.0 ng/ml are reported as reference range, some men with levels below 4.0 ng/ml can have prostate cancer and many men with PSA above 4.0 ng/ml do not have prostate cancer.  Other tests such as free PSA, age specific reference  ranges, PSA velocity and PSA doubling time may be helpful especially in men less than 11 years old. Performed at [facility], [place] [address] [place], [place] [address]      Pathology Orders Placed This Encounter  Procedures  . CBC with Differential/Platelet    Standing Status:   Future    Standing Expiration Date:   03/05/2020  . Comprehensive metabolic panel    Standing Status:   Future    Standing Expiration Date:   03/05/2020  . Lactate dehydrogenase    Standing Status:   Future    Standing Expiration Date:   03/05/2020  . CEA    Standing Status:   Future    Standing Expiration Date:   03/05/2020  . PSA    Standing Status:   Future    Standing Expiration Date:   03/05/2020       Zoila Shutter MD

## 2019-03-03 ENCOUNTER — Other Ambulatory Visit: Payer: Self-pay

## 2019-03-03 ENCOUNTER — Inpatient Hospital Stay (HOSPITAL_COMMUNITY): Payer: Medicare Other | Attending: Internal Medicine

## 2019-03-03 ENCOUNTER — Other Ambulatory Visit (HOSPITAL_COMMUNITY): Payer: Medicare Other

## 2019-03-03 DIAGNOSIS — C182 Malignant neoplasm of ascending colon: Secondary | ICD-10-CM | POA: Insufficient documentation

## 2019-03-03 DIAGNOSIS — C61 Malignant neoplasm of prostate: Secondary | ICD-10-CM | POA: Diagnosis present

## 2019-03-03 LAB — CBC WITH DIFFERENTIAL/PLATELET
Abs Immature Granulocytes: 0.02 10*3/uL (ref 0.00–0.07)
Basophils Absolute: 0 10*3/uL (ref 0.0–0.1)
Basophils Relative: 0 %
Eosinophils Absolute: 0.4 10*3/uL (ref 0.0–0.5)
Eosinophils Relative: 4 %
HCT: 47.2 % (ref 39.0–52.0)
Hemoglobin: 14.6 g/dL (ref 13.0–17.0)
Immature Granulocytes: 0 %
Lymphocytes Relative: 18 %
Lymphs Abs: 1.6 10*3/uL (ref 0.7–4.0)
MCH: 27.6 pg (ref 26.0–34.0)
MCHC: 30.9 g/dL (ref 30.0–36.0)
MCV: 89.2 fL (ref 80.0–100.0)
Monocytes Absolute: 1 10*3/uL (ref 0.1–1.0)
Monocytes Relative: 11 %
Neutro Abs: 6 10*3/uL (ref 1.7–7.7)
Neutrophils Relative %: 67 %
Platelets: 274 10*3/uL (ref 150–400)
RBC: 5.29 MIL/uL (ref 4.22–5.81)
RDW: 18 % — ABNORMAL HIGH (ref 11.5–15.5)
WBC: 9.1 10*3/uL (ref 4.0–10.5)
nRBC: 0 % (ref 0.0–0.2)

## 2019-03-03 LAB — COMPREHENSIVE METABOLIC PANEL
ALT: 18 U/L (ref 0–44)
AST: 16 U/L (ref 15–41)
Albumin: 3.9 g/dL (ref 3.5–5.0)
Alkaline Phosphatase: 96 U/L (ref 38–126)
Anion gap: 6 (ref 5–15)
BUN: 37 mg/dL — ABNORMAL HIGH (ref 8–23)
CO2: 25 mmol/L (ref 22–32)
Calcium: 9.7 mg/dL (ref 8.9–10.3)
Chloride: 111 mmol/L (ref 98–111)
Creatinine, Ser: 1.39 mg/dL — ABNORMAL HIGH (ref 0.61–1.24)
GFR calc Af Amer: 57 mL/min — ABNORMAL LOW (ref >60)
GFR calc non Af Amer: 49 mL/min — ABNORMAL LOW (ref >60)
Glucose, Bld: 116 mg/dL — ABNORMAL HIGH (ref 70–99)
Potassium: 5.4 mmol/L — ABNORMAL HIGH (ref 3.5–5.1)
Sodium: 142 mmol/L (ref 135–145)
Total Bilirubin: 0.4 mg/dL (ref 0.3–1.2)
Total Protein: 7.4 g/dL (ref 6.5–8.1)

## 2019-03-03 LAB — LACTATE DEHYDROGENASE: LDH: 113 U/L (ref 98–192)

## 2019-03-03 LAB — PSA: Prostatic Specific Antigen: 0.67 ng/mL (ref 0.00–4.00)

## 2019-03-04 LAB — CEA: CEA: 6.5 ng/mL — ABNORMAL HIGH (ref 0.0–4.7)

## 2019-03-05 NOTE — Progress Notes (Signed)
For review

## 2019-03-11 ENCOUNTER — Other Ambulatory Visit: Payer: Self-pay

## 2019-03-11 ENCOUNTER — Inpatient Hospital Stay (HOSPITAL_COMMUNITY): Payer: Medicare Other | Attending: Hematology | Admitting: Nurse Practitioner

## 2019-03-11 DIAGNOSIS — Z79899 Other long term (current) drug therapy: Secondary | ICD-10-CM | POA: Insufficient documentation

## 2019-03-11 DIAGNOSIS — I7090 Unspecified atherosclerosis: Secondary | ICD-10-CM | POA: Diagnosis not present

## 2019-03-11 DIAGNOSIS — E278 Other specified disorders of adrenal gland: Secondary | ICD-10-CM | POA: Diagnosis not present

## 2019-03-11 DIAGNOSIS — Z809 Family history of malignant neoplasm, unspecified: Secondary | ICD-10-CM | POA: Diagnosis not present

## 2019-03-11 DIAGNOSIS — Z8546 Personal history of malignant neoplasm of prostate: Secondary | ICD-10-CM | POA: Diagnosis present

## 2019-03-11 DIAGNOSIS — F17210 Nicotine dependence, cigarettes, uncomplicated: Secondary | ICD-10-CM | POA: Insufficient documentation

## 2019-03-11 DIAGNOSIS — Z85038 Personal history of other malignant neoplasm of large intestine: Secondary | ICD-10-CM | POA: Insufficient documentation

## 2019-03-11 DIAGNOSIS — C182 Malignant neoplasm of ascending colon: Secondary | ICD-10-CM | POA: Diagnosis not present

## 2019-03-11 DIAGNOSIS — R001 Bradycardia, unspecified: Secondary | ICD-10-CM | POA: Diagnosis not present

## 2019-03-11 DIAGNOSIS — Z886 Allergy status to analgesic agent status: Secondary | ICD-10-CM | POA: Diagnosis not present

## 2019-03-11 NOTE — Assessment & Plan Note (Signed)
1.  Stage IIIb ascending colon cancer, grade 2: - Underwent surgery on 02/22/2009 followed by Xeloda x6 cycles that was completed on 07/21/2009. -He is undergone a colonoscopy on 02/14/2018 with hyperplastic polyps removed with no malignancy seen. -Patient is an active smoker. -Labs done on 03/03/2019 showed WBC 9.1, hemoglobin 14.6, platelets 274, CEA 6.5, creatinine 1.39. -He will follow-up with GI as directed. -He will follow-up with [place] in 1 year with repeat labs.  2.  Prostate cancer in 2004: - He was diagnosed with prostate cancer in 05/2003, he is status post radical prostatectomy in 05/2013. -Patient had a slowly rising PSA concerning for biochemical recurrence.  Imaging in 2016 was negative for metastatic disease. -His urologist is in [place].  He was seen by Dr. Meda Coffee rad [place] in [place] for XRT. -Labs on 03/03/2019 showed his PSA 0.67 -She will follow-up with his urologist as directed.

## 2019-03-11 NOTE — Patient Instructions (Signed)
[facility] at [facility] Discharge Instructions  Follow up in 1 year with labs    Thank you for choosing [facility] at [facility] to provide your oncology and hematology care.  To afford each patient quality time with our provider, please arrive at least 15 minutes before your scheduled appointment time.   If you have a lab appointment with the Cancer Center please come in thru the  Main Entrance and check in at the main information desk  You need to re-schedule your appointment should you arrive 10 or more minutes late.  We strive to give you quality time with our providers, and arriving late affects you and other patients whose appointments are after yours.  Also, if you no show three or more times for appointments you may be dismissed from the clinic at the providers discretion.     Again, thank you for choosing [facility].  Our hope is that these requests will decrease the amount of time that you wait before being seen by our physicians.       _____________________________________________________________  Should you have questions after your visit to [facility], please contact our office at [phone] between the hours of 8:00 a.m. and 4:30 p.m.  Voicemails left after 4:00 p.m. will not be returned until the following business day.  For prescription refill requests, have your pharmacy contact our office and allow 72 hours.    Cancer Center Support Programs:   > Cancer Support Group  2nd Tuesday of the month 1pm-2pm, Journey Room    

## 2019-03-11 NOTE — Progress Notes (Signed)
Roberto [name], [place] [id number]   CLINIC:  Medical Oncology/Hematology  PCP:  [name], MD [address] [place] [address] [phone]   REASON FOR VISIT: Follow-up for ascending colon cancer  CURRENT THERAPY: observation  BRIEF ONCOLOGIC HISTORY:  Oncology History  Colon cancer ([place])  05/30/2011 Initial Diagnosis   Colon cancer (Roberto [name])   09/16/2014 Imaging   CT CAP- 1. No evidence of metastatic disease from the patient's colon or prostate cancer. 2. Stable atherosclerosis. 3. Stable bilateral adrenal hyperplasia.       INTERVAL HISTORY:  Roberto [name] 78 y.o. male returns for routine follow-up for ascending colon cancer. Patient reports he has been doing well since his last visit. He denies any recent hospitalizations over the past year. He reports he did have a pace maker placed 3 months ago due to his bradycardia. He is now feeling better and not SOB or fatigued. He also reports he has no lower extremity edema since it was placed as well. Denies any nausea, vomiting, or diarrhea. Denies any Roberto pains. Had not noticed any recent bleeding such as epistaxis, hematuria or hematochezia. Denies recent chest pain on exertion, shortness of breath on minimal exertion, pre-syncopal episodes, or palpitations. Denies any numbness or tingling in hands or feet. Denies any recent fevers, infections, or recent hospitalizations. Patient reports appetite at 100% and energy level at 100%.  He is eating well maintain his weight at this time.    REVIEW OF SYSTEMS:  Review of Systems  All other systems reviewed and are negative.    PAST MEDICAL/SURGICAL HISTORY:  Past Medical History:  Diagnosis Date  . 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 Colon cancer (Roberto [name])    colon 2010/surg and chemo  . Headache   . Hypercholesteremia   . Hypertension   . Roberto-A-Cath in place 04/02/2013  . Prostate cancer (Roberto [name])    prostate 2004/surg  . Varicose  veins of lower limb 1985   rt. leg   Past Surgical History:  Procedure Laterality Date  . BIOPSY  02/14/2018   Procedure: BIOPSY;  Surgeon: Rogene Houston, MD;  [facility];  Service: Endoscopy;;  colon  . COLON SURGERY    . COLONOSCOPY N/A 11/11/2014   Procedure: COLONOSCOPY;  Surgeon: Rogene Houston, MD;  [facility];  Service: Endoscopy;  Laterality: N/A;  830  . COLONOSCOPY N/A 02/14/2018   Procedure: COLONOSCOPY;  Surgeon: Rogene Houston, MD;  [facility];  Service: Endoscopy;  Laterality: N/A;  1030  . HAND TENDON SURGERY     lt. hand repair  . POLYPECTOMY  02/14/2018   Procedure: POLYPECTOMY;  Surgeon: Rogene Houston, MD;  [facility];  Service: Endoscopy;;  colon  . Roberto-A-CATH REMOVAL Left 10/11/2015   Procedure: REMOVAL Roberto-A-CATH LEFT CHEST;  Surgeon: Aviva Signs, MD;  Location: [facility];  Service: General;  Laterality: Left;  . PROSTATE SURGERY    . STYLOID PROCESS EXCISION    . VARICOSE VEIN SURGERY  1980's     SOCIAL HISTORY:  Social History   Socioeconomic History  . Marital status: Married    Spouse name: Not on file  . Number of children: Not on file  . Years of education: Not on file  . Highest education level: Not on file  Occupational History  . Not on file  Social Needs  . Financial resource strain: Not on file  . Food  insecurity    Worry: Not on file    Inability: Not on file  . Transportation needs    Medical: Not on file    Non-medical: Not on file  Tobacco Use  . Smoking status: Current Every Day Smoker    Packs/day: 1.00    Years: 51.00    Pack years: 51.00    Types: Cigarettes  . Smokeless tobacco: Never Used  Substance and Sexual Activity  . Alcohol use: No  . Drug use: No  . Sexual activity: Not on file  Lifestyle  . Physical activity    Days per week: Not on file    Minutes per session: Not on file  . Stress: Not on file  Relationships  . Social Herbalist on phone: Not  on file    Gets together: Not on file    Attends religious service: Not on file    Active member of club or organization: Not on file    Attends meetings of clubs or organizations: Not on file    Relationship status: Not on file  . Intimate partner violence    Fear of current or ex partner: Not on file    Emotionally abused: Not on file    Physically abused: Not on file    Forced sexual activity: Not on file  Other Topics Concern  . Not on file  Social History Narrative  . Not on file    FAMILY HISTORY:  Family History  Problem Relation Age of Onset  . Cancer Father     CURRENT MEDICATIONS:  Outpatient Encounter Medications as of 03/11/2019  Medication Sig  . amLODipine (NORVASC) 5 MG tablet Take 5 mg by mouth daily.  [name] aspirin 81 MG tablet Take 1 tablet (81 mg total) by mouth daily.  . diclofenac (CATAFLAM) 50 MG tablet Take 1 tablet (50 mg total) by mouth daily.  [name] losartan (COZAAR) 100 MG tablet Take 100 mg by mouth daily.  [name] lovastatin (MEVACOR) 20 MG tablet Take 20 mg by mouth daily.  . meloxicam (MOBIC) 7.5 MG tablet   . acetaminophen (TYLENOL) 325 MG tablet Take 650 mg by mouth every 6 (six) hours as needed for mild pain.   . dorzolamide (TRUSOPT) 2 % ophthalmic solution INSTILL 1 DROP INTO LEFT EYE TWICE A DAY   No facility-administered encounter medications on file as of 03/11/2019.     ALLERGIES:  Allergies  Allergen Reactions  . Aspirin Other (See Comments)    No high dosages - tightness in the chest  . Tetanus Toxoids Swelling    Arm became very swollen after injection (occurred approx. 40 years ago.)     PHYSICAL EXAM:  ECOG Performance status: 1  Vitals:   03/11/19 1100  BP: (!) 150/61  Pulse: 75  Resp: 16  Temp: (!) 97.5 F (36.4 C)  SpO2: 98%   Filed Weights   03/11/19 1100  Weight: 191 lb 6 oz (86.8 kg)    Physical Exam Constitutional:      Appearance: Normal appearance. He is normal weight.  Cardiovascular:     Rate and Rhythm: Normal  rate and regular rhythm.     Heart sounds: Normal heart sounds.  Pulmonary:     Effort: Pulmonary effort is normal.     Breath sounds: Normal breath sounds.  Abdominal:     General: Bowel sounds are normal.     Palpations: Abdomen is soft.  Musculoskeletal: Normal range of motion.  Skin:  General: Skin is warm and dry.  Neurological:     Mental Status: He is alert and oriented to person, place, and time. Mental status is at baseline.  Psychiatric:        Mood and Affect: Mood normal.        Behavior: Behavior normal.        Thought Content: Thought content normal.        Judgment: Judgment normal.      LABORATORY DATA:  I have reviewed the labs as listed.  CBC    Component Value Date/Time   WBC 9.1 03/03/2019 0901   RBC 5.29 03/03/2019 0901   HGB 14.6 03/03/2019 0901   HCT 47.2 03/03/2019 0901   PLT 274 03/03/2019 0901   MCV 89.2 03/03/2019 0901   MCH 27.6 03/03/2019 0901   MCHC 30.9 03/03/2019 0901   RDW 18.0 (H) 03/03/2019 0901   LYMPHSABS 1.6 03/03/2019 0901   MONOABS 1.0 03/03/2019 0901   EOSABS 0.4 03/03/2019 0901   BASOSABS 0.0 03/03/2019 0901   CMP Latest Ref Rng & Units 03/03/2019 02/27/2018 09/05/2016  Glucose 70 - 99 mg/dL 116(H) 208(H) 119(H)  BUN 8 - 23 mg/dL 37(H) 25(H) 17  Creatinine 0.61 - 1.24 mg/dL 1.39(H) 1.27(H) 1.05  Sodium 135 - 145 mmol/L 142 140 138  Potassium 3.5 - 5.1 mmol/L 5.4(H) 4.5 4.9  Chloride 98 - 111 mmol/L 111 106 102  CO2 22 - 32 mmol/L 25 26 29   Calcium 8.9 - 10.3 mg/dL 9.7 9.1 9.6  Total Protein 6.5 - 8.1 g/dL 7.4 7.1 7.3  Total Bilirubin 0.3 - 1.2 mg/dL 0.4 0.4 0.6  Alkaline Phos 38 - 126 U/L 96 98 84  AST 15 - 41 U/L 16 17 14(L)  ALT 0 - 44 U/L 18 13 11(L)    I personally performed a face-to-face visit.  All questions were answered to patient's stated satisfaction. Encouraged patient to call with any Roberto concerns or questions before his next visit to the cancer center and we can certain see him sooner, if needed.      ASSESSMENT & PLAN:   Colon cancer ([place]) 1.  Stage IIIb ascending colon cancer, grade 2: - Underwent surgery on 02/22/2009 followed by Xeloda x6 cycles that was completed on 07/21/2009. -He is undergone a colonoscopy on 02/14/2018 with hyperplastic polyps removed with no malignancy seen. -Patient is an active smoker. -Labs done on 03/03/2019 showed WBC 9.1, hemoglobin 14.6, platelets 274, CEA 6.5, creatinine 1.39. -He will follow-up with GI as directed. -He will follow-up with [place] in 1 year with repeat labs.  2.  Prostate cancer in 2004: - He was diagnosed with prostate cancer in 05/2003, he is status post radical prostatectomy in 05/2013. -Patient had a slowly rising PSA concerning for biochemical recurrence.  Imaging in 2016 was negative for metastatic disease. -His urologist is in [place].  He was seen by Dr. Meda Coffee rad [facility] in [place] for XRT. -Labs on 03/03/2019 showed his PSA 0.67 -She will follow-up with his urologist as directed.      Orders placed this encounter:  Orders Placed This Encounter  Procedures  . Lactate dehydrogenase  . CBC with Differential/Platelet  . Comprehensive metabolic panel  . PSA  . CEA  . Vitamin B12  . VITAMIN D 25 Hydroxy (Vit-D Deficiency, Fractures)     Francene Finders, FNP-C [facility] [phone]

## 2020-03-15 ENCOUNTER — Other Ambulatory Visit (HOSPITAL_COMMUNITY): Payer: Self-pay | Admitting: *Deleted

## 2020-03-15 DIAGNOSIS — C182 Malignant neoplasm of ascending colon: Secondary | ICD-10-CM

## 2020-03-16 ENCOUNTER — Inpatient Hospital Stay (HOSPITAL_COMMUNITY): Payer: Medicare Other | Attending: Hematology

## 2020-03-16 ENCOUNTER — Other Ambulatory Visit: Payer: Self-pay

## 2020-03-16 DIAGNOSIS — G479 Sleep disorder, unspecified: Secondary | ICD-10-CM | POA: Diagnosis not present

## 2020-03-16 DIAGNOSIS — Z79899 Other long term (current) drug therapy: Secondary | ICD-10-CM | POA: Insufficient documentation

## 2020-03-16 DIAGNOSIS — M549 Dorsalgia, unspecified: Secondary | ICD-10-CM | POA: Diagnosis not present

## 2020-03-16 DIAGNOSIS — Z936 Other artificial openings of urinary tract status: Secondary | ICD-10-CM | POA: Diagnosis not present

## 2020-03-16 DIAGNOSIS — D509 Iron deficiency anemia, unspecified: Secondary | ICD-10-CM | POA: Insufficient documentation

## 2020-03-16 DIAGNOSIS — F17210 Nicotine dependence, cigarettes, uncomplicated: Secondary | ICD-10-CM | POA: Diagnosis not present

## 2020-03-16 DIAGNOSIS — Z809 Family history of malignant neoplasm, unspecified: Secondary | ICD-10-CM | POA: Insufficient documentation

## 2020-03-16 DIAGNOSIS — C182 Malignant neoplasm of ascending colon: Secondary | ICD-10-CM | POA: Diagnosis present

## 2020-03-16 DIAGNOSIS — Z9079 Acquired absence of other genital organ(s): Secondary | ICD-10-CM | POA: Insufficient documentation

## 2020-03-16 DIAGNOSIS — I2510 Atherosclerotic heart disease of native coronary artery without angina pectoris: Secondary | ICD-10-CM | POA: Insufficient documentation

## 2020-03-16 DIAGNOSIS — C61 Malignant neoplasm of prostate: Secondary | ICD-10-CM | POA: Diagnosis not present

## 2020-03-16 DIAGNOSIS — Z886 Allergy status to analgesic agent status: Secondary | ICD-10-CM | POA: Insufficient documentation

## 2020-03-16 DIAGNOSIS — Z933 Colostomy status: Secondary | ICD-10-CM | POA: Insufficient documentation

## 2020-03-16 DIAGNOSIS — C679 Malignant neoplasm of bladder, unspecified: Secondary | ICD-10-CM | POA: Diagnosis present

## 2020-03-16 LAB — COMPREHENSIVE METABOLIC PANEL
ALT: 17 U/L (ref 0–44)
AST: 14 U/L — ABNORMAL LOW (ref 15–41)
Albumin: 2.9 g/dL — ABNORMAL LOW (ref 3.5–5.0)
Alkaline Phosphatase: 60 U/L (ref 38–126)
Anion gap: 12 (ref 5–15)
BUN: 33 mg/dL — ABNORMAL HIGH (ref 8–23)
CO2: 24 mmol/L (ref 22–32)
Calcium: 9.1 mg/dL (ref 8.9–10.3)
Chloride: 104 mmol/L (ref 98–111)
Creatinine, Ser: 1.22 mg/dL (ref 0.61–1.24)
GFR calc Af Amer: >60 mL/min (ref >60)
GFR calc non Af Amer: 57 mL/min — ABNORMAL LOW (ref >60)
Glucose, Bld: 143 mg/dL — ABNORMAL HIGH (ref 70–99)
Potassium: 3.3 mmol/L — ABNORMAL LOW (ref 3.5–5.1)
Sodium: 140 mmol/L (ref 135–145)
Total Bilirubin: 0.3 mg/dL (ref 0.3–1.2)
Total Protein: 7.1 g/dL (ref 6.5–8.1)

## 2020-03-16 LAB — CBC WITH DIFFERENTIAL/PLATELET
Abs Immature Granulocytes: 0.1 10*3/uL — ABNORMAL HIGH (ref 0.00–0.07)
Basophils Absolute: 0 10*3/uL (ref 0.0–0.1)
Basophils Relative: 0 %
Eosinophils Absolute: 0 10*3/uL (ref 0.0–0.5)
Eosinophils Relative: 0 %
HCT: 29.8 % — ABNORMAL LOW (ref 39.0–52.0)
Hemoglobin: 8.4 g/dL — ABNORMAL LOW (ref 13.0–17.0)
Immature Granulocytes: 1 %
Lymphocytes Relative: 6 %
Lymphs Abs: 0.9 10*3/uL (ref 0.7–4.0)
MCH: 21.1 pg — ABNORMAL LOW (ref 26.0–34.0)
MCHC: 28.2 g/dL — ABNORMAL LOW (ref 30.0–36.0)
MCV: 74.9 fL — ABNORMAL LOW (ref 80.0–100.0)
Monocytes Absolute: 1.7 10*3/uL — ABNORMAL HIGH (ref 0.1–1.0)
Monocytes Relative: 11 %
Neutro Abs: 12.3 10*3/uL — ABNORMAL HIGH (ref 1.7–7.7)
Neutrophils Relative %: 82 %
Platelets: 542 10*3/uL — ABNORMAL HIGH (ref 150–400)
RBC: 3.98 MIL/uL — ABNORMAL LOW (ref 4.22–5.81)
RDW: 21.2 % — ABNORMAL HIGH (ref 11.5–15.5)
WBC: 15.1 10*3/uL — ABNORMAL HIGH (ref 4.0–10.5)
nRBC: 0 % (ref 0.0–0.2)

## 2020-03-16 LAB — LACTATE DEHYDROGENASE: LDH: 106 U/L (ref 98–192)

## 2020-03-16 LAB — PSA: Prostatic Specific Antigen: 0.05 ng/mL (ref 0.00–4.00)

## 2020-03-17 LAB — CEA: CEA: 4.4 ng/mL (ref 0.0–4.7)

## 2020-03-23 ENCOUNTER — Other Ambulatory Visit: Payer: Self-pay

## 2020-03-23 ENCOUNTER — Inpatient Hospital Stay (HOSPITAL_BASED_OUTPATIENT_CLINIC_OR_DEPARTMENT_OTHER): Payer: Medicare Other | Admitting: Nurse Practitioner

## 2020-03-23 DIAGNOSIS — C182 Malignant neoplasm of ascending colon: Secondary | ICD-10-CM

## 2020-03-23 NOTE — Assessment & Plan Note (Signed)
1.  Stage IIIb ascending colon cancer, grade 2: - Underwent surgery on 02/22/2009 followed by Xeloda x6 cycles that was completed on 07/21/2009. -He is undergone a colonoscopy on 02/14/2018 with hyperplastic polyps removed with no malignancy seen. -Patient is an active smoker. -Labs done on 03/16/2020 showed WBC 15.1, hemoglobin 8.4, platelets 542, CEA 4.4, creatinine 1.22. -He will follow-up with [place] in 1 year with repeat labs.  2.  Prostate cancer in 2004: - He was diagnosed with prostate cancer in 05/2003, he is status post radical prostatectomy in 05/2013. -Patient had a slowly rising PSA concerning for biochemical recurrence.  Imaging in 2016 was negative for metastatic disease. -His urologist is in [place].  He was seen by Dr. Meda Coffee rad [facility] in [place] for XRT. -Labs on 03/03/2019 showed his PSA 0.67 -She will follow-up with his urologist as directed.  3. New diagnosis bladder cancer: -Diagnosed in March at [facility]. -He underwent surgery and now has a urostomy and colostomy. -He is being followed by them every 2 months.  4.  Iron deficiency anemia: -Patient is being followed by his primary care doctor.  He just received 2 infusions of IV iron. -We will recheck labs in 5 weeks.

## 2020-03-23 NOTE — Progress Notes (Signed)
Roberto [name], [place] [address]   CLINIC:  Medical Oncology/Hematology  PCP:  [name], MD [address] [place] [address] [phone]   REASON FOR VISIT: Follow-up for colon cancer   CURRENT THERAPY: observation  BRIEF ONCOLOGIC HISTORY:  Oncology History  Colon cancer ([place])  05/30/2011 Initial Diagnosis   Colon cancer ([place])   09/16/2014 Imaging   CT CAP- 1. No evidence of metastatic disease from the patient's colon or prostate cancer. 2. Stable atherosclerosis. 3. Stable bilateral adrenal hyperplasia.       INTERVAL HISTORY:  Roberto [name] 79 y.o. male returns for routine follow-up for colon cancer. Patient was diagnosed bladder cancer in March 2021.  Patient  is receiving all of his treatment at [facility]. Denies any nausea, vomiting, or diarrhea. Denies any new pains. Had not noticed any recent bleeding such as epistaxis, hematuria or hematochezia. Denies recent chest pain on exertion, shortness of breath on minimal exertion, pre-syncopal episodes, or palpitations. Denies any numbness or tingling in hands or feet. Patient reports appetite at 0% and energy level at 0%.     REVIEW OF SYSTEMS:  Review of Systems  Musculoskeletal: Positive for back pain.  Psychiatric/Behavioral: Positive for sleep disturbance.  All other systems reviewed and are negative.    PAST MEDICAL/SURGICAL HISTORY:  Past Medical History:  Diagnosis Date  . 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 Colon cancer ([place])    colon 2010/surg and chemo  . Headache   . Hypercholesteremia   . Hypertension   . Port-A-Cath in place 04/02/2013  . Prostate cancer ([place])    prostate 2004/surg  . Varicose veins of lower limb 1985   rt. leg   Past Surgical History:  Procedure Laterality Date  . BIOPSY  02/14/2018   Procedure: BIOPSY;  Surgeon: Rogene Houston, MD;  [facility];  Service: Endoscopy;;  colon  . COLON SURGERY     . COLONOSCOPY N/A 11/11/2014   Procedure: COLONOSCOPY;  Surgeon: Rogene Houston, MD;  [facility];  Service: Endoscopy;  Laterality: N/A;  830  . COLONOSCOPY N/A 02/14/2018   Procedure: COLONOSCOPY;  Surgeon: Rogene Houston, MD;  [facility];  Service: Endoscopy;  Laterality: N/A;  1030  . HAND TENDON SURGERY     lt. hand repair  . POLYPECTOMY  02/14/2018   Procedure: POLYPECTOMY;  Surgeon: Rogene Houston, MD;  [facility];  Service: Endoscopy;;  colon  . PORT-A-CATH REMOVAL Left 10/11/2015   Procedure: REMOVAL PORT-A-CATH LEFT CHEST;  Surgeon: Aviva Signs, MD;  Location: [facility];  Service: General;  Laterality: Left;  . PROSTATE SURGERY    . STYLOID PROCESS EXCISION    . VARICOSE VEIN SURGERY  1980's     SOCIAL HISTORY:  Social History   Socioeconomic History  . Marital status: Married    Spouse name: Not on file  . Number of children: Not on file  . Years of education: Not on file  . Highest education level: Not on file  Occupational History  . Not on file  Tobacco Use  . Smoking status: Current Every Day Smoker    Packs/day: 1.00    Years: 51.00    Pack years: 51.00    Types: Cigarettes  . Smokeless tobacco: Never Used  Vaping Use  . Vaping Use: Never used  Substance and Sexual Activity  . Alcohol use: No  . Drug use:  No  . Sexual activity: Not on file  Other Topics Concern  . Not on file  Social History Narrative  . Not on file   Social Determinants of Health   Financial Resource Strain:   . Difficulty of Paying Living Expenses:   Food Insecurity:   . Worried About Charity fundraiser in the Last Year:   . Arboriculturist in the Last Year:   Transportation Needs:   . Film/video editor (Medical):   [name] Lack of Transportation (Non-Medical):   Physical Activity:   . Days of Exercise per Week:   . Minutes of Exercise per Session:   Stress:   . Feeling of Stress :   Social Connections:   . Frequency of Communication  with Friends and Family:   . Frequency of Social Gatherings with Friends and Family:   . Attends Religious Services:   . Active Member of Clubs or Organizations:   . Attends Archivist Meetings:   [name] Marital Status:   Intimate Partner Violence:   . Fear of Current or Ex-Partner:   . Emotionally Abused:   [name] Physically Abused:   . Sexually Abused:     FAMILY HISTORY:  Family History  Problem Relation Age of Onset  . Cancer Father     CURRENT MEDICATIONS:  Outpatient Encounter Medications as of 03/23/2020  Medication Sig  . acetaminophen (TYLENOL) 325 MG tablet Take 650 mg by mouth every 6 (six) hours as needed for mild pain.   [name] amLODipine (NORVASC) 10 MG tablet Take 10 mg by mouth daily.  [name] aspirin 81 MG tablet Take 1 tablet (81 mg total) by mouth daily.  [name] gabapentin (NEURONTIN) 600 MG tablet Take 600 mg by mouth 4 (four) times daily.  [name] ibuprofen (ADVIL) 200 MG tablet Take by mouth.  . lovastatin (MEVACOR) 40 MG tablet Take 40 mg by mouth at bedtime.  . [DISCONTINUED] Aspirin Buf,CaCarb-MgCarb-MgO, 81 MG TABS Take by mouth.  . [DISCONTINUED] lovastatin (MEVACOR) 40 MG tablet Take by mouth.  . [DISCONTINUED] amLODipine (NORVASC) 5 MG tablet Take 5 mg by mouth daily.  . [DISCONTINUED] diclofenac (CATAFLAM) 50 MG tablet Take 1 tablet (50 mg total) by mouth daily.  . [DISCONTINUED] dorzolamide (TRUSOPT) 2 % ophthalmic solution INSTILL 1 DROP INTO LEFT EYE TWICE A DAY  . [DISCONTINUED] losartan (COZAAR) 100 MG tablet Take 100 mg by mouth daily.  . [DISCONTINUED] lovastatin (MEVACOR) 20 MG tablet Take 20 mg by mouth daily.  . [DISCONTINUED] meloxicam (MOBIC) 7.5 MG tablet    No facility-administered encounter medications on file as of 03/23/2020.    ALLERGIES:  Allergies  Allergen Reactions  . Aspirin Other (See Comments)    No high dosages - tightness in the chest  . Tetanus Toxoids Swelling    Arm became very swollen after injection (occurred approx. 40 years ago.)       PHYSICAL EXAM:  ECOG Performance status: 1  Vitals:   03/23/20 1058  BP: 131/61  Pulse: 86  Resp: 18  Temp: 98.9 F (37.2 C)  SpO2: 97%   Filed Weights   03/23/20 1058  Weight: 163 lb 12.8 oz (74.3 kg)   Physical Exam Constitutional:      Appearance: Normal appearance. He is normal weight.  Cardiovascular:     Rate and Rhythm: Normal rate and regular rhythm.     Heart sounds: Normal heart sounds.  Pulmonary:     Effort: Pulmonary effort is normal.  Breath sounds: Normal breath sounds.  Abdominal:     General: Bowel sounds are normal.     Palpations: Abdomen is soft.  Musculoskeletal:        General: Normal range of motion.  Skin:    General: Skin is warm.  Neurological:     Mental Status: He is alert and oriented to person, place, and time. Mental status is at baseline.  Psychiatric:        Mood and Affect: Mood normal.        Behavior: Behavior normal.        Thought Content: Thought content normal.        Judgment: Judgment normal.      LABORATORY DATA:  I have reviewed the labs as listed.  CBC    Component Value Date/Time   WBC 15.1 (H) 03/16/2020 1033   RBC 3.98 (L) 03/16/2020 1033   HGB 8.4 (L) 03/16/2020 1033   HCT 29.8 (L) 03/16/2020 1033   PLT 542 (H) 03/16/2020 1033   MCV 74.9 (L) 03/16/2020 1033   MCH 21.1 (L) 03/16/2020 1033   MCHC 28.2 (L) 03/16/2020 1033   RDW 21.2 (H) 03/16/2020 1033   LYMPHSABS 0.9 03/16/2020 1033   MONOABS 1.7 (H) 03/16/2020 1033   EOSABS 0.0 03/16/2020 1033   BASOSABS 0.0 03/16/2020 1033   CMP Latest Ref Rng & Units 03/16/2020 03/03/2019 02/27/2018  Glucose 70 - 99 mg/dL 143(H) 116(H) 208(H)  BUN 8 - 23 mg/dL 33(H) 37(H) 25(H)  Creatinine 0.61 - 1.24 mg/dL 1.22 1.39(H) 1.27(H)  Sodium 135 - 145 mmol/L 140 142 140  Potassium 3.5 - 5.1 mmol/L 3.3(L) 5.4(H) 4.5  Chloride 98 - 111 mmol/L 104 111 106  CO2 22 - 32 mmol/L 24 25 26   Calcium 8.9 - 10.3 mg/dL 9.1 9.7 9.1  Total Protein 6.5 - 8.1 g/dL 7.1 7.4 7.1   Total Bilirubin 0.3 - 1.2 mg/dL 0.3 0.4 0.4  Alkaline Phos 38 - 126 U/L 60 96 98  AST 15 - 41 U/L 14(L) 16 17  ALT 0 - 44 U/L 17 18 13     All questions were answered to patient's stated satisfaction. Encouraged patient to call with any new concerns or questions before his next visit to the cancer center and we can certain see him sooner, if needed.     ASSESSMENT & PLAN:  Colon cancer ([place]) 1.  Stage IIIb ascending colon cancer, grade 2: - Underwent surgery on 02/22/2009 followed by Xeloda x6 cycles that was completed on 07/21/2009. -He is undergone a colonoscopy on 02/14/2018 with hyperplastic polyps removed with no malignancy seen. -Patient is an active smoker. -Labs done on 03/16/2020 showed WBC 15.1, hemoglobin 8.4, platelets 542, CEA 4.4, creatinine 1.22. -He will follow-up with [place] in 1 year with repeat labs.  2.  Prostate cancer in 2004: - He was diagnosed with prostate cancer in 05/2003, he is status post radical prostatectomy in 05/2013. -Patient had a slowly rising PSA concerning for biochemical recurrence.  Imaging in 2016 was negative for metastatic disease. -His urologist is in [place].  He was seen by Dr. Meda Coffee rad [place] in [place] for XRT. -Labs on 03/03/2019 showed his PSA 0.67 -She will follow-up with his urologist as directed.  3. New diagnosis bladder cancer: -Diagnosed in March at [facility]. -He underwent surgery and now has a urostomy and colostomy. -He is being followed by them every 2 months.  4.  Iron deficiency anemia: -Patient is being followed by  his primary care doctor.  He just received 2 infusions of IV iron. -We will recheck labs in 5 weeks.     Orders placed this encounter:  Orders Placed This Encounter  Procedures  . CBC with Differential/Platelet  . Comprehensive metabolic panel  . Ferritin  . Iron and TIBC  . Reticulocytes  . Vitamin B12  . VITAMIN D 25 Hydroxy (Vit-D Deficiency, Fractures)  . Folate   . Haptoglobin      Francene Finders, FNP-C [place] [phone]

## 2020-04-21 ENCOUNTER — Inpatient Hospital Stay (HOSPITAL_COMMUNITY): Payer: Medicare Other | Attending: Hematology

## 2020-04-21 ENCOUNTER — Other Ambulatory Visit: Payer: Self-pay

## 2020-04-21 DIAGNOSIS — C182 Malignant neoplasm of ascending colon: Secondary | ICD-10-CM | POA: Insufficient documentation

## 2020-04-21 LAB — CBC WITH DIFFERENTIAL/PLATELET
Abs Immature Granulocytes: 0.15 10*3/uL — ABNORMAL HIGH (ref 0.00–0.07)
Basophils Absolute: 0 10*3/uL (ref 0.0–0.1)
Basophils Relative: 0 %
Eosinophils Absolute: 0.1 10*3/uL (ref 0.0–0.5)
Eosinophils Relative: 1 %
HCT: 37.3 % — ABNORMAL LOW (ref 39.0–52.0)
Hemoglobin: 10.5 g/dL — ABNORMAL LOW (ref 13.0–17.0)
Immature Granulocytes: 1 %
Lymphocytes Relative: 7 %
Lymphs Abs: 1 10*3/uL (ref 0.7–4.0)
MCH: 22.2 pg — ABNORMAL LOW (ref 26.0–34.0)
MCHC: 28.2 g/dL — ABNORMAL LOW (ref 30.0–36.0)
MCV: 78.7 fL — ABNORMAL LOW (ref 80.0–100.0)
Monocytes Absolute: 1.1 10*3/uL — ABNORMAL HIGH (ref 0.1–1.0)
Monocytes Relative: 8 %
Neutro Abs: 11.8 10*3/uL — ABNORMAL HIGH (ref 1.7–7.7)
Neutrophils Relative %: 83 %
Platelets: 578 10*3/uL — ABNORMAL HIGH (ref 150–400)
RBC: 4.74 MIL/uL (ref 4.22–5.81)
RDW: 24.2 % — ABNORMAL HIGH (ref 11.5–15.5)
WBC: 14.2 10*3/uL — ABNORMAL HIGH (ref 4.0–10.5)
nRBC: 0 % (ref 0.0–0.2)

## 2020-04-21 LAB — RETICULOCYTES
Immature Retic Fract: 20.2 % — ABNORMAL HIGH (ref 2.3–15.9)
RBC.: 4.73 MIL/uL (ref 4.22–5.81)
Retic Count, Absolute: 50.6 10*3/uL (ref 19.0–186.0)
Retic Ct Pct: 1.1 % (ref 0.4–3.1)

## 2020-04-21 LAB — COMPREHENSIVE METABOLIC PANEL
ALT: 6 U/L (ref 0–44)
AST: 10 U/L — ABNORMAL LOW (ref 15–41)
Albumin: 2.9 g/dL — ABNORMAL LOW (ref 3.5–5.0)
Alkaline Phosphatase: 69 U/L (ref 38–126)
Anion gap: 12 (ref 5–15)
BUN: 22 mg/dL (ref 8–23)
CO2: 28 mmol/L (ref 22–32)
Calcium: 9 mg/dL (ref 8.9–10.3)
Chloride: 100 mmol/L (ref 98–111)
Creatinine, Ser: 1.15 mg/dL (ref 0.61–1.24)
GFR calc Af Amer: >60 mL/min (ref >60)
GFR calc non Af Amer: >60 mL/min (ref >60)
Glucose, Bld: 140 mg/dL — ABNORMAL HIGH (ref 70–99)
Potassium: 2.6 mmol/L — CL (ref 3.5–5.1)
Sodium: 140 mmol/L (ref 135–145)
Total Bilirubin: 0.2 mg/dL — ABNORMAL LOW (ref 0.3–1.2)
Total Protein: 7.1 g/dL (ref 6.5–8.1)

## 2020-04-21 LAB — IRON AND TIBC
Iron: 12 ug/dL — ABNORMAL LOW (ref 45–182)
Saturation Ratios: 7 % — ABNORMAL LOW (ref 17.9–39.5)
TIBC: 178 ug/dL — ABNORMAL LOW (ref 250–450)
UIBC: 166 ug/dL

## 2020-04-21 LAB — FOLATE: Folate: 7.6 ng/mL (ref >5.9)

## 2020-04-21 LAB — VITAMIN B12: Vitamin B-12: 163 pg/mL — ABNORMAL LOW (ref 180–914)

## 2020-04-21 LAB — VITAMIN D 25 HYDROXY (VIT D DEFICIENCY, FRACTURES): Vit D, 25-Hydroxy: 12.02 ng/mL — ABNORMAL LOW (ref 30–100)

## 2020-04-21 LAB — FERRITIN: Ferritin: 172 ng/mL (ref 24–336)

## 2020-04-21 MED ORDER — POTASSIUM CHLORIDE CRYS ER 20 MEQ PO TBCR: EXTENDED_RELEASE_TABLET | ORAL | 0 refills | Status: AC

## 2020-04-21 MED ORDER — POTASSIUM CHLORIDE ER 10 MEQ PO TBCR: 10.0000 meq | EXTENDED_RELEASE_TABLET | Freq: Every day | ORAL | 1 refills | Status: AC

## 2020-04-22 LAB — HAPTOGLOBIN: Haptoglobin: 464 mg/dL — ABNORMAL HIGH (ref 34–355)

## 2020-04-28 ENCOUNTER — Ambulatory Visit (HOSPITAL_COMMUNITY): Payer: Medicare Other | Admitting: Hematology

## 2020-04-28 ENCOUNTER — Ambulatory Visit (HOSPITAL_COMMUNITY): Payer: Medicare Other | Admitting: Nurse Practitioner

## 2020-05-07 DEATH — deceased
